# Patient Record
Sex: Female | Born: 1960 | Race: White | Hispanic: Yes | Smoking: Never smoker
Health system: Southern US, Community
[De-identification: ages and names within clinical notes are randomized; demographics above are authoritative.]

## PROBLEM LIST (undated history)

## (undated) DIAGNOSIS — Z923 Personal history of irradiation: Secondary | ICD-10-CM

## (undated) DIAGNOSIS — I10 Essential (primary) hypertension: Secondary | ICD-10-CM

## (undated) DIAGNOSIS — K219 Gastro-esophageal reflux disease without esophagitis: Secondary | ICD-10-CM

## (undated) DIAGNOSIS — F419 Anxiety disorder, unspecified: Secondary | ICD-10-CM

## (undated) DIAGNOSIS — T7840XA Allergy, unspecified, initial encounter: Secondary | ICD-10-CM

## (undated) DIAGNOSIS — F32A Depression, unspecified: Secondary | ICD-10-CM

## (undated) DIAGNOSIS — M797 Fibromyalgia: Secondary | ICD-10-CM

## (undated) DIAGNOSIS — F329 Major depressive disorder, single episode, unspecified: Secondary | ICD-10-CM

## (undated) DIAGNOSIS — N3281 Overactive bladder: Secondary | ICD-10-CM

## (undated) DIAGNOSIS — C50919 Malignant neoplasm of unspecified site of unspecified female breast: Secondary | ICD-10-CM

## (undated) DIAGNOSIS — G43909 Migraine, unspecified, not intractable, without status migrainosus: Secondary | ICD-10-CM

## (undated) DIAGNOSIS — M1990 Unspecified osteoarthritis, unspecified site: Secondary | ICD-10-CM

## (undated) HISTORY — DX: Anxiety disorder, unspecified: F41.9

## (undated) HISTORY — PX: BREAST LUMPECTOMY: SHX2

## (undated) HISTORY — DX: Overactive bladder: N32.81

## (undated) HISTORY — DX: Allergy, unspecified, initial encounter: T78.40XA

## (undated) HISTORY — DX: Unspecified osteoarthritis, unspecified site: M19.90

## (undated) HISTORY — DX: Malignant neoplasm of unspecified site of unspecified female breast: C50.919

## (undated) HISTORY — DX: Gastro-esophageal reflux disease without esophagitis: K21.9

## (undated) HISTORY — DX: Fibromyalgia: M79.7

## (undated) HISTORY — DX: Essential (primary) hypertension: I10

## (undated) HISTORY — DX: Depression, unspecified: F32.A

## (undated) HISTORY — PX: EYE SURGERY: SHX253

## (undated) HISTORY — DX: Major depressive disorder, single episode, unspecified: F32.9

## (undated) HISTORY — PX: BREAST BIOPSY: SHX20

## (undated) HISTORY — DX: Migraine, unspecified, not intractable, without status migrainosus: G43.909

---

## 1990-12-22 HISTORY — PX: PELVIC LAPAROSCOPY: SHX162

## 1998-03-20 ENCOUNTER — Ambulatory Visit (HOSPITAL_COMMUNITY): Admission: RE | Admit: 1998-03-20 | Discharge: 1998-03-20 | Payer: Self-pay | Admitting: Gynecology

## 1999-07-29 ENCOUNTER — Other Ambulatory Visit: Admission: RE | Admit: 1999-07-29 | Discharge: 1999-07-29 | Payer: Self-pay | Admitting: Gynecology

## 1999-10-12 ENCOUNTER — Encounter: Payer: Self-pay | Admitting: Family Medicine

## 1999-10-12 ENCOUNTER — Ambulatory Visit (HOSPITAL_COMMUNITY): Admission: RE | Admit: 1999-10-12 | Discharge: 1999-10-12 | Payer: Self-pay | Admitting: Family Medicine

## 2000-09-08 ENCOUNTER — Other Ambulatory Visit: Admission: RE | Admit: 2000-09-08 | Discharge: 2000-09-08 | Payer: Self-pay | Admitting: Gynecology

## 2001-10-13 ENCOUNTER — Other Ambulatory Visit: Admission: RE | Admit: 2001-10-13 | Discharge: 2001-10-13 | Payer: Self-pay | Admitting: Gynecology

## 2002-10-14 ENCOUNTER — Other Ambulatory Visit: Admission: RE | Admit: 2002-10-14 | Discharge: 2002-10-14 | Payer: Self-pay | Admitting: Gynecology

## 2003-10-23 ENCOUNTER — Other Ambulatory Visit: Admission: RE | Admit: 2003-10-23 | Discharge: 2003-10-23 | Payer: Self-pay | Admitting: Gynecology

## 2004-12-17 ENCOUNTER — Other Ambulatory Visit: Admission: RE | Admit: 2004-12-17 | Discharge: 2004-12-17 | Payer: Self-pay | Admitting: Gynecology

## 2005-02-06 ENCOUNTER — Ambulatory Visit (HOSPITAL_COMMUNITY): Admission: RE | Admit: 2005-02-06 | Discharge: 2005-02-06 | Payer: Self-pay | Admitting: Gynecology

## 2005-03-03 ENCOUNTER — Encounter: Admission: RE | Admit: 2005-03-03 | Discharge: 2005-03-03 | Payer: Self-pay | Admitting: Gynecology

## 2005-04-21 ENCOUNTER — Encounter: Admission: RE | Admit: 2005-04-21 | Discharge: 2005-04-21 | Payer: Self-pay | Admitting: Gynecology

## 2005-10-22 ENCOUNTER — Encounter: Admission: RE | Admit: 2005-10-22 | Discharge: 2005-10-22 | Payer: Self-pay | Admitting: Gynecology

## 2006-01-20 ENCOUNTER — Other Ambulatory Visit: Admission: RE | Admit: 2006-01-20 | Discharge: 2006-01-20 | Payer: Self-pay | Admitting: Gynecology

## 2006-04-13 ENCOUNTER — Encounter: Admission: RE | Admit: 2006-04-13 | Discharge: 2006-04-13 | Payer: Self-pay | Admitting: Gynecology

## 2006-05-08 ENCOUNTER — Encounter: Admission: RE | Admit: 2006-05-08 | Discharge: 2006-05-08 | Payer: Self-pay | Admitting: Gynecology

## 2006-12-09 ENCOUNTER — Encounter: Admission: RE | Admit: 2006-12-09 | Discharge: 2006-12-09 | Payer: Self-pay | Admitting: Gynecology

## 2007-05-11 ENCOUNTER — Encounter: Admission: RE | Admit: 2007-05-11 | Discharge: 2007-05-11 | Payer: Self-pay | Admitting: Gynecology

## 2007-05-13 ENCOUNTER — Encounter: Admission: RE | Admit: 2007-05-13 | Discharge: 2007-05-13 | Payer: Self-pay | Admitting: Gynecology

## 2007-05-13 ENCOUNTER — Encounter (INDEPENDENT_AMBULATORY_CARE_PROVIDER_SITE_OTHER): Payer: Self-pay | Admitting: Diagnostic Radiology

## 2007-05-20 ENCOUNTER — Encounter: Admission: RE | Admit: 2007-05-20 | Discharge: 2007-05-20 | Payer: Self-pay | Admitting: Gynecology

## 2007-05-21 ENCOUNTER — Other Ambulatory Visit: Admission: RE | Admit: 2007-05-21 | Discharge: 2007-05-21 | Payer: Self-pay | Admitting: Gynecology

## 2007-05-24 ENCOUNTER — Encounter: Admission: RE | Admit: 2007-05-24 | Discharge: 2007-05-24 | Payer: Self-pay | Admitting: Gynecology

## 2007-06-03 ENCOUNTER — Encounter (INDEPENDENT_AMBULATORY_CARE_PROVIDER_SITE_OTHER): Payer: Self-pay | Admitting: Radiology

## 2007-06-03 ENCOUNTER — Encounter: Admission: RE | Admit: 2007-06-03 | Discharge: 2007-06-03 | Payer: Self-pay | Admitting: Gynecology

## 2007-07-05 ENCOUNTER — Encounter: Admission: RE | Admit: 2007-07-05 | Discharge: 2007-07-05 | Payer: Self-pay | Admitting: General Surgery

## 2007-07-09 ENCOUNTER — Encounter: Admission: RE | Admit: 2007-07-09 | Discharge: 2007-07-09 | Payer: Self-pay | Admitting: General Surgery

## 2007-07-09 ENCOUNTER — Ambulatory Visit (HOSPITAL_BASED_OUTPATIENT_CLINIC_OR_DEPARTMENT_OTHER): Admission: RE | Admit: 2007-07-09 | Discharge: 2007-07-09 | Payer: Self-pay | Admitting: General Surgery

## 2007-07-09 ENCOUNTER — Encounter (INDEPENDENT_AMBULATORY_CARE_PROVIDER_SITE_OTHER): Payer: Self-pay | Admitting: General Surgery

## 2007-07-19 ENCOUNTER — Ambulatory Visit: Payer: Self-pay | Admitting: Oncology

## 2007-08-04 LAB — CBC WITH DIFFERENTIAL/PLATELET
BASO%: 1 % (ref 0.0–2.0)
Basophils Absolute: 0.1 10*3/uL (ref 0.0–0.1)
EOS%: 1 % (ref 0.0–7.0)
HGB: 13.5 g/dL (ref 11.6–15.9)
MCH: 28.9 pg (ref 26.0–34.0)
MCHC: 33.9 g/dL (ref 32.0–36.0)
MONO%: 6.4 % (ref 0.0–13.0)
RBC: 4.67 10*6/uL (ref 3.70–5.32)
RDW: 11.1 % — ABNORMAL LOW (ref 11.3–14.5)
lymph#: 1.4 10*3/uL (ref 0.9–3.3)

## 2007-08-09 ENCOUNTER — Ambulatory Visit (HOSPITAL_COMMUNITY): Admission: RE | Admit: 2007-08-09 | Discharge: 2007-08-09 | Payer: Self-pay | Admitting: Oncology

## 2007-08-09 LAB — COMPREHENSIVE METABOLIC PANEL
ALT: 21 U/L (ref 0–35)
AST: 14 U/L (ref 0–37)
Albumin: 4.8 g/dL (ref 3.5–5.2)
Alkaline Phosphatase: 55 U/L (ref 39–117)
Calcium: 10 mg/dL (ref 8.4–10.5)
Chloride: 102 mEq/L (ref 96–112)
Potassium: 4.2 mEq/L (ref 3.5–5.3)
Sodium: 139 mEq/L (ref 135–145)
Total Protein: 7.8 g/dL (ref 6.0–8.3)

## 2007-08-10 ENCOUNTER — Ambulatory Visit: Admission: RE | Admit: 2007-08-10 | Discharge: 2007-10-25 | Payer: Self-pay | Admitting: Radiation Oncology

## 2007-09-22 DIAGNOSIS — C50919 Malignant neoplasm of unspecified site of unspecified female breast: Secondary | ICD-10-CM

## 2007-09-22 HISTORY — DX: Malignant neoplasm of unspecified site of unspecified female breast: C50.919

## 2007-09-30 ENCOUNTER — Ambulatory Visit: Payer: Self-pay | Admitting: Oncology

## 2007-11-23 ENCOUNTER — Ambulatory Visit: Payer: Self-pay | Admitting: *Deleted

## 2007-11-23 ENCOUNTER — Emergency Department (HOSPITAL_COMMUNITY): Admission: EM | Admit: 2007-11-23 | Discharge: 2007-11-23 | Payer: Self-pay | Admitting: Emergency Medicine

## 2007-11-23 ENCOUNTER — Inpatient Hospital Stay (HOSPITAL_COMMUNITY): Admission: RE | Admit: 2007-11-23 | Discharge: 2007-11-26 | Payer: Self-pay | Admitting: *Deleted

## 2007-12-23 ENCOUNTER — Ambulatory Visit: Payer: Self-pay | Admitting: Oncology

## 2007-12-28 LAB — CBC WITH DIFFERENTIAL/PLATELET
BASO%: 1 % (ref 0.0–2.0)
Basophils Absolute: 0 10*3/uL (ref 0.0–0.1)
EOS%: 3.3 % (ref 0.0–7.0)
MCH: 30.2 pg (ref 26.0–34.0)
MCHC: 35.1 g/dL (ref 32.0–36.0)
MCV: 86 fL (ref 81.0–101.0)
MONO%: 7 % (ref 0.0–13.0)
RBC: 4.17 10*6/uL (ref 3.70–5.32)
RDW: 13.4 % (ref 11.3–14.5)

## 2007-12-28 LAB — COMPREHENSIVE METABOLIC PANEL
AST: 16 U/L (ref 0–37)
Albumin: 4.1 g/dL (ref 3.5–5.2)
Alkaline Phosphatase: 37 U/L — ABNORMAL LOW (ref 39–117)
BUN: 14 mg/dL (ref 6–23)
Potassium: 4.5 mEq/L (ref 3.5–5.3)

## 2008-02-09 ENCOUNTER — Ambulatory Visit: Payer: Self-pay | Admitting: Oncology

## 2008-02-18 ENCOUNTER — Encounter: Admission: RE | Admit: 2008-02-18 | Discharge: 2008-02-18 | Payer: Self-pay | Admitting: General Surgery

## 2008-05-11 ENCOUNTER — Encounter: Admission: RE | Admit: 2008-05-11 | Discharge: 2008-05-11 | Payer: Self-pay | Admitting: Gynecology

## 2008-07-22 ENCOUNTER — Ambulatory Visit: Payer: Self-pay | Admitting: Oncology

## 2008-09-12 ENCOUNTER — Ambulatory Visit: Payer: Self-pay | Admitting: Oncology

## 2008-09-12 LAB — CBC WITH DIFFERENTIAL/PLATELET
BASO%: 0.4 % (ref 0.0–2.0)
LYMPH%: 19.5 % (ref 14.0–48.0)
MCHC: 34.6 g/dL (ref 32.0–36.0)
MONO#: 0.3 10*3/uL (ref 0.1–0.9)
Platelets: 318 10*3/uL (ref 145–400)
RBC: 4.09 10*6/uL (ref 3.70–5.32)
RDW: 13 % (ref 11.3–14.5)
WBC: 5.9 10*3/uL (ref 3.9–10.0)

## 2008-09-12 LAB — COMPREHENSIVE METABOLIC PANEL
AST: 38 U/L — ABNORMAL HIGH (ref 0–37)
Albumin: 3.8 g/dL (ref 3.5–5.2)
Alkaline Phosphatase: 41 U/L (ref 39–117)
BUN: 12 mg/dL (ref 6–23)
Creatinine, Ser: 0.81 mg/dL (ref 0.40–1.20)
Potassium: 4.3 mEq/L (ref 3.5–5.3)

## 2008-09-12 LAB — HCG, SERUM, QUALITATIVE: Preg, Serum: NEGATIVE

## 2008-09-12 LAB — RESEARCH LABS

## 2009-01-30 ENCOUNTER — Ambulatory Visit: Payer: Self-pay | Admitting: Oncology

## 2009-03-21 ENCOUNTER — Ambulatory Visit: Payer: Self-pay | Admitting: Oncology

## 2009-03-26 LAB — CBC WITH DIFFERENTIAL/PLATELET
BASO%: 0.3 % (ref 0.0–2.0)
EOS%: 2.8 % (ref 0.0–7.0)
MCH: 30.3 pg (ref 25.1–34.0)
MCHC: 34.4 g/dL (ref 31.5–36.0)
RBC: 4.32 10*6/uL (ref 3.70–5.45)
RDW: 13.3 % (ref 11.2–14.5)
WBC: 6.1 10*3/uL (ref 3.9–10.3)
lymph#: 1.1 10*3/uL (ref 0.9–3.3)

## 2009-03-26 LAB — COMPREHENSIVE METABOLIC PANEL
ALT: 59 U/L — ABNORMAL HIGH (ref 0–35)
AST: 40 U/L — ABNORMAL HIGH (ref 0–37)
Calcium: 9.2 mg/dL (ref 8.4–10.5)
Chloride: 104 mEq/L (ref 96–112)
Creatinine, Ser: 0.76 mg/dL (ref 0.40–1.20)
Potassium: 4.2 mEq/L (ref 3.5–5.3)
Sodium: 136 mEq/L (ref 135–145)
Total Protein: 7.1 g/dL (ref 6.0–8.3)

## 2009-03-26 LAB — CANCER ANTIGEN 27.29: CA 27.29: 17 U/mL (ref 0–39)

## 2009-04-17 LAB — COMPREHENSIVE METABOLIC PANEL
ALT: 39 U/L — ABNORMAL HIGH (ref 0–35)
Albumin: 4 g/dL (ref 3.5–5.2)
CO2: 29 mEq/L (ref 19–32)
Calcium: 9.4 mg/dL (ref 8.4–10.5)
Chloride: 102 mEq/L (ref 96–112)
Potassium: 4.7 mEq/L (ref 3.5–5.3)
Sodium: 138 mEq/L (ref 135–145)
Total Bilirubin: 0.9 mg/dL (ref 0.3–1.2)
Total Protein: 7.5 g/dL (ref 6.0–8.3)

## 2009-05-14 ENCOUNTER — Encounter: Admission: RE | Admit: 2009-05-14 | Discharge: 2009-05-14 | Payer: Self-pay | Admitting: Oncology

## 2009-09-13 ENCOUNTER — Ambulatory Visit: Payer: Self-pay | Admitting: Oncology

## 2009-09-18 LAB — CBC WITH DIFFERENTIAL/PLATELET
Basophils Absolute: 0 10*3/uL (ref 0.0–0.1)
Eosinophils Absolute: 0 10*3/uL (ref 0.0–0.5)
HGB: 12.8 g/dL (ref 11.6–15.9)
LYMPH%: 20 % (ref 14.0–49.7)
MCV: 86.8 fL (ref 79.5–101.0)
MONO#: 0.3 10*3/uL (ref 0.1–0.9)
NEUT#: 4.7 10*3/uL (ref 1.5–6.5)
Platelets: 299 10*3/uL (ref 145–400)
RBC: 4.22 10*6/uL (ref 3.70–5.45)
RDW: 12.6 % (ref 11.2–14.5)
WBC: 6.4 10*3/uL (ref 3.9–10.3)

## 2009-09-19 LAB — COMPREHENSIVE METABOLIC PANEL
Albumin: 4.1 g/dL (ref 3.5–5.2)
BUN: 12 mg/dL (ref 6–23)
CO2: 23 mEq/L (ref 19–32)
Glucose, Bld: 117 mg/dL — ABNORMAL HIGH (ref 70–99)
Potassium: 4 mEq/L (ref 3.5–5.3)
Sodium: 140 mEq/L (ref 135–145)
Total Protein: 6.9 g/dL (ref 6.0–8.3)

## 2009-09-19 LAB — CANCER ANTIGEN 27.29: CA 27.29: 15 U/mL (ref 0–39)

## 2009-09-19 LAB — VITAMIN D 25 HYDROXY (VIT D DEFICIENCY, FRACTURES): Vit D, 25-Hydroxy: 35 ng/mL (ref 30–89)

## 2009-10-17 ENCOUNTER — Encounter: Payer: Self-pay | Admitting: Gynecology

## 2009-10-17 ENCOUNTER — Ambulatory Visit: Payer: Self-pay | Admitting: Oncology

## 2009-10-17 ENCOUNTER — Ambulatory Visit: Payer: Self-pay | Admitting: Gynecology

## 2009-10-17 ENCOUNTER — Other Ambulatory Visit: Admission: RE | Admit: 2009-10-17 | Discharge: 2009-10-17 | Payer: Self-pay | Admitting: Gynecology

## 2010-05-06 ENCOUNTER — Ambulatory Visit: Payer: Self-pay | Admitting: Gynecology

## 2010-05-08 ENCOUNTER — Encounter: Admission: RE | Admit: 2010-05-08 | Discharge: 2010-05-08 | Payer: Self-pay | Admitting: Gynecology

## 2010-06-18 ENCOUNTER — Ambulatory Visit: Payer: Self-pay | Admitting: Gynecology

## 2010-07-04 ENCOUNTER — Ambulatory Visit (HOSPITAL_COMMUNITY): Admission: RE | Admit: 2010-07-04 | Discharge: 2010-07-04 | Payer: Self-pay | Admitting: Oncology

## 2010-10-18 ENCOUNTER — Ambulatory Visit: Payer: Self-pay | Admitting: Oncology

## 2010-10-23 LAB — COMPREHENSIVE METABOLIC PANEL
ALT: 15 U/L (ref 0–35)
Alkaline Phosphatase: 42 U/L (ref 39–117)
CO2: 28 mEq/L (ref 19–32)
Creatinine, Ser: 0.83 mg/dL (ref 0.40–1.20)
Sodium: 142 mEq/L (ref 135–145)
Total Bilirubin: 0.7 mg/dL (ref 0.3–1.2)
Total Protein: 6.9 g/dL (ref 6.0–8.3)

## 2010-10-23 LAB — CBC WITH DIFFERENTIAL/PLATELET
BASO%: 0.8 % (ref 0.0–2.0)
EOS%: 2.2 % (ref 0.0–7.0)
HCT: 34.8 % (ref 34.8–46.6)
LYMPH%: 40.3 % (ref 14.0–49.7)
MCH: 30.8 pg (ref 25.1–34.0)
MCHC: 34.9 g/dL (ref 31.5–36.0)
MCV: 88.3 fL (ref 79.5–101.0)
MONO#: 0.3 10*3/uL (ref 0.1–0.9)
MONO%: 6.6 % (ref 0.0–14.0)
NEUT%: 50.1 % (ref 38.4–76.8)
Platelets: 310 10*3/uL (ref 145–400)
RBC: 3.95 10*6/uL (ref 3.70–5.45)
WBC: 5.2 10*3/uL (ref 3.9–10.3)

## 2010-10-29 ENCOUNTER — Ambulatory Visit: Payer: Self-pay | Admitting: Gynecology

## 2010-10-29 ENCOUNTER — Other Ambulatory Visit: Admission: RE | Admit: 2010-10-29 | Discharge: 2010-10-29 | Payer: Self-pay | Admitting: Gynecology

## 2010-10-30 ENCOUNTER — Ambulatory Visit: Payer: Self-pay | Admitting: Gynecology

## 2011-01-11 ENCOUNTER — Other Ambulatory Visit: Payer: Self-pay | Admitting: Oncology

## 2011-01-11 DIAGNOSIS — Z0000 Encounter for general adult medical examination without abnormal findings: Secondary | ICD-10-CM

## 2011-03-09 LAB — CREATININE, SERUM: GFR calc Af Amer: >60 mL/min (ref >60)

## 2011-04-14 ENCOUNTER — Other Ambulatory Visit: Payer: Self-pay | Admitting: Oncology

## 2011-04-14 DIAGNOSIS — Z0000 Encounter for general adult medical examination without abnormal findings: Secondary | ICD-10-CM

## 2011-04-15 ENCOUNTER — Ambulatory Visit
Admission: RE | Admit: 2011-04-15 | Discharge: 2011-04-15 | Disposition: A | Discharge status: Home / Self Care | Payer: BC Managed Care – PPO | Source: Ambulatory Visit | Attending: Oncology | Admitting: Oncology

## 2011-04-15 ENCOUNTER — Other Ambulatory Visit: Payer: Self-pay | Admitting: Oncology

## 2011-04-15 DIAGNOSIS — Z0000 Encounter for general adult medical examination without abnormal findings: Secondary | ICD-10-CM

## 2011-04-21 ENCOUNTER — Ambulatory Visit
Admission: RE | Admit: 2011-04-21 | Discharge: 2011-04-21 | Disposition: A | Discharge status: Home / Self Care | Payer: BC Managed Care – PPO | Source: Ambulatory Visit | Attending: Anesthesiology | Admitting: Anesthesiology

## 2011-04-21 ENCOUNTER — Other Ambulatory Visit: Payer: Self-pay | Admitting: Anesthesiology

## 2011-04-21 DIAGNOSIS — M549 Dorsalgia, unspecified: Secondary | ICD-10-CM

## 2011-05-06 NOTE — Discharge Summary (Signed)
Kiara Wilson, Kiara Wilson                ACCOUNT NO.:  [id number]   MEDICAL RECORD NO.:  [id number]          PATIENT TYPE:  IPS   LOCATION:  0603                          FACILITY[facility]  [facility]   PHYSICIAN:  Jasmine Pang, M.D. DATE OF BIRTH:  16-Mar-1961   DATE OF ADMISSION:  11/23/2007  DATE OF DISCHARGE:  11/26/2007                               DISCHARGE SUMMARY   IDENTIFICATION:  A 50 year old Latino female from [place], [place], who was admitted on a voluntary basis on November 23, 2007.   HISTORY OF PRESENT ILLNESS:  The patient admits to using alcohol and  narcotics abuse coupled with increased depression.  She denies active  suicidal ideation, but admits I would like to go to sleep and not wake  up.  She was taken off Wellbutrin 4 weeks ago, because of the  interaction with her tamoxifen.  Her depression has worsened since then.  The patient is voicing leaving her husband to give him his freedom  because of the burden.  She did report active suicidal ideation this  a.m. with a plan to overdose.  The patient is married.  She has no  children.  She is a Biochemist, clinical.  She has had a  history of depression by her statements.  Her mother and sister had a  history of depression as well.  The patient denies use of street drugs  or illegal prescription use.  She is suffering from fibromyalgia.  She  also had recent breast cancer and has migraine headaches.   CURRENT MEDICATIONS:  She is currently on tamoxifen, Lexapro, and  Robaxin.   ALLERGIES:  She is allergic to AMOXICILLIN and ULTRAM.   PHYSICAL FINDINGS:  There were no acute physical or medical problems  noted on exam.   HOSPITAL COURSE:  Upon admission, the patient was placed on Librium  detox protocol.  She was also placed on Ambien 10 mg p.o. q.h.s. p.r.n.  On November 23, 2007, her Lexapro was increased to 30 mg p.o. daily.  She  was placed on tamoxifen 20 mg at bedtime and Xanax 0.5 mg q.6 h.  p.r.n.  anxiety.  The Librium detox protocol was discontinued.  The patient  tolerated these medications well with no significant side effects.  She  was friendly and cooperative with me in individual sessions.  She also  participated appropriately in unit therapeutic groups and activities.  She discussed her increased drinking because she has begun to feel so  hopeless and has a depressed mood.  She sees Valinda Hoar, [place] for  medication management.  She states she does worse in the winter and  feels her fibromyalgia also has a role in her depression.  The patient's  mental status improved as hospitalization progressed.  There was no  suicidal or homicidal ideation.  Her husband was coming in for a family  session.  He was worried about her coming home too soon, but was very  supportive.  She was able to talk about some unresolved grief.  She has  concerning decision she has made in things that have  happened to her.  She is looking forward to exploring these options more in outpatient  therapy.  At the end of therapy session, her husband felt comfortable  with her leaving the hospital.  Her mood had improved from admission  status.  Mood was less depressed and anxious.  Affect wide range.  There  was no suicidal or homicidal ideation.  No thoughts of self-injurious  behavior.  No auditory or visual hallucinations.  No paranoia or  delusions.  Thoughts were logical and goal-directed.  Thought content,  no predominant theme.  Cognitive was grossly back to baseline.  It was  felt that the patient was safe to be discharged today.   DISCHARGE DIAGNOSES:  AXIS I:  Major depression, recurrent, severe  without psychosis.  Alcohol abuse and opiate abuse.  AXIS II:  None.  AXIS III:  Fibromyalgia, breast cancer, and migraine headaches.  AXIS IV:  Severe (problems with primary support group, occupational  problem, burden of psychiatric illness, burden of chemical abuse and  illness, burden of  medical problems).  AXIS V:  Global assessment of functioning was 50 upon discharge.  Global  assessment of functioning was 40 upon admission.  Global assessment of  functioning highest past year was 75.   DISCHARGE PLAN:  There were no specific activity level or dietary  restrictions.   POSTHOSPITAL CARE PLAN:  The patient will see Valinda Hoar, [place] on  December 01, 2007 at 11 o'clock a.m.  She will see Mertha Baars therapist  on December 01, 2007 at 5:30 p.m.   DISCHARGE MEDICATIONS:  Tamoxifen 20 mg p.o. q.h.s., Robaxin 500 mg p.o.  daily as directed by her family physician, Lexapro 30 mg daily, Xanax  0.25 mg 1 tablet up to twice a day if needed for anxiety, Lunesta to  resume as directed by her primary care physician for insomnia.      Jasmine Pang, M.D.  Electronically Signed     BHS/MEDQ  D:  12/17/2007  T:  12/17/2007  Job:  [id number]

## 2011-05-06 NOTE — H&P (Signed)
Kiara Wilson, Kiara Wilson                ACCOUNT NO.:  [id number]   MEDICAL RECORD NO.:  [id number]          PATIENT TYPE:  IPS   LOCATION:  0603                          [facility]ILITY[facility]  [facility]   PHYSICIAN:  Jasmine Pang, M.D. DATE OF BIRTH:  [birth year]   DATE OF ADMISSION:  11/23/2007  DATE OF DISCHARGE:                       PSYCHIATRIC ADMISSION ASSESSMENT   IDENTIFYING INFORMATION:  A 50 year old Latino female from [place]  who was admitted on a voluntary basis on November 23, 2007.   HISTORY OF PRESENT ILLNESS:  The patient admits to alcohol and narcotic  abuse accompanied with increasing depression.  She denies active  suicidal ideation, but admits I would like to go to sleep and not wake  up.  She was taken off of Wellbutrin four weeks ago, because of the  interaction with tamoxifen.  She is on tamoxifen for breast cancer,  which recently has metastasized.  She has had recent radiation therapy  as well.  Her depression is worsened since stopping the Wellbutrin.  She  is voicing leaving her husband to give him his freedom because of the  burden.  She did report active suicidal ideation on the morning of  admission with a plan to overdose.  The patient has been seen by  Valinda Hoar for medication management.  She was on Wellbutrin 150 mg  p.o. daily and Lexapro 20 mg p.o. daily.  She had to stop the Wellbutrin  recently due to the negative interaction with tamoxifen.  She reports a  worsening of her depressive symptoms during the past several weeks after  discontinuing it.   SOCIAL HISTORY:  The patient is married; without children.  She is a  Biochemist, clinical.  She states her husband is very  supportive, but she feels that she has been a burden to him.  She is  unhappy in her job and had been in a previous job that she loved quite a  bit.  She feels bad for having left that job.   FAMILY HISTORY:  The patient's mother and sister have depression.   SUBSTANCE  ABUSE HISTORY:  The patient is abusing alcohol, as indicated  above.  She admits to drinking too much for the past week to the point  of getting drunk.  She also has been increasing using her pain  medications because she has felt so much more pain recently.  She has  fibromyalgia.  She denies street drugs or illegal drug use.   PAST MEDICAL HISTORY:  The patient has fibromyalgia.  She has breast  cancer and migraine headaches.   CURRENT MEDICATIONS:  She is currently on tamoxifen, Lexapro, and  Robaxin.   DRUG ALLERGIES:  AMOXICILLIN AND ULTRAM.   PHYSICAL FINDINGS:  See the physical exam.  No acute abnormal physical  or medical problems noted.   MENTAL STATUS EXAM:  The patient was friendly and cooperative and  appropriate.  She was crying.  She was well dressed with good hygiene.  Speech was articulate, soft, and slow.  Mood was depressed.  Affect was  consistent with mood.  Her eye contact was good.  Affect was also  tearful.  There was currently no suicidal or homicidal ideation.  No  thoughts of self-injurious behavior.  She states the suicidal ideation  has resolved.  The thought processes were logical and goal-directed.  Thought content, no predominant theme.  Cognitive was grossly intact.   DISCHARGE DIAGNOSES:  AXIS I:  Major depression, recurrent, severe  without psychosis.  Alcohol abuse and opiate abuse.  AXIS II:  Deferred.  AXIS III:  Fibromyalgia, migraine headaches, and breast cancer.  AXIS IV:  Severe (problems with primary support group, occupational  problem, medical issues, and burden of psychiatric illness).  AXIS V:  Global assessment of functioning was 40 upon admission.  Global  assessment of functioning highest past year was 75.   PLAN:  The patient will be involved in unit therapeutic groups and  activities.  Her Lexapro will be increased from 20 mg to 30 mg daily.  We discussed the possibility of Cymbalta at some point.  If the Lexapro  increase does  not work, we will assist the patient with short-term  disability paperwork sheet that she should not be going out to work at  this point.  Once this was decided, she felt much better.  We will also  do a complete physical exam.  Total length of stay will be 3-5 days,  possibly discharge on November 25, 2007, if she feels well enough.      Jasmine Pang, M.D.  Electronically Signed     BHS/MEDQ  D:  11/24/2007  T:  11/25/2007  Job:  [id number]

## 2011-05-06 NOTE — Op Note (Signed)
Kiara Wilson, Kiara Wilson                ACCOUNT NO.:  [id number]   MEDICAL RECORD NO.:  [id number]          PATIENT TYPE:  AMB   LOCATION:  DSC                          FACILITY:  [facility]   PHYSICIAN:  Ollen Gross. Vernell Morgans, M.D. DATE OF BIRTH:  [birth year]   DATE OF PROCEDURE:  07/09/2007  DATE OF DISCHARGE:  07/09/2007                               OPERATIVE REPORT   PREOPERATIVE DIAGNOSIS:  Right breast DCIS.   POSTOPERATIVE DIAGNOSIS:  Right breast DCIS.   PROCEDURE:  Right breast needle-localized lumpectomy and sentinel lymph  node biopsy x5 with injection of blue dye.   SURGEON:  Ollen Gross. Vernell Morgans, M.D.   ANESTHESIA:  General.   PROCEDURE:  After informed consent was obtained, the patient was brought  to the operating room, placed in supine position on the table.  After  induction of general anesthesia, the patient's right breast and axilla  were prepped with Betadine and draped in usual sterile manner.  Earlier  in the day the patient had undergone an injection of 1 mCi of technetium  sulfur colloid in the subareolar area.  She had also undergone a wire  localization procedure and the wire was entering the breast in the 12  o'clock position heading inferiorly. At this point 2 mL of methylene  blue and 3 mL of injectable saline were injected also in the subareolar  area and the breast was massaged for several minutes.  A NeoProbe with  the device was used to identify an area of the increased activity in the  right axilla.  A small transverse incision was made with a 15 blade  knife overlying this hot spot.  This incision was carried down through  the skin and subcutaneous tissue sharply with electrocautery until the  axilla was entered using the NeoProbe to guide direction of blunt  dissection was carried out in the axilla using a hemostat until a hot  lymph node was identified. This first lymph node was not blue.  It had  ex vivo counts around 1500, it was sent as sentinel node #1.  A  second  lymph node was also identified using the NeoProbe and blunt dissection.  This lymph node was hot and blue and had ex vivo counts around 4500 and  was sent as sentinel node #2.  Three other small sentinel nodes were  identified that were hot but not blue and these were all excised by  combination of sharp Bovie dissection and ligation of the lymphatics  with hemostats dividing them and ligating with 3-0 Vicryl ties.  Once  this was accomplished, there were five sentinel nodes sent. There was no  other palpable lymph nodes or significant activity in the axilla.  The  wound was examined, found be hemostatic.  Deep layer of the wound was  closed with interrupted 3-0 Vicryl stitches.  The skin was closed a  running 4-0 Monocryl subcuticular stitch.  Next attention was turned to  the right breast.  The mass in the right breast was in the 12 o'clock  position.  The curvilinear incision was made just above  the nipple-  areolar complex overlying the mass.  This was done with a 10 blade  knife.  This incision was carried down through the skin and subcutaneous  tissue sharply with electrocautery.  Once the breast tissue was entered  the path of the wire was able to be appreciated and there was some  palpable mass in this upper portion of the breast.  The mass that was  palpable in the path of the wire was widely excised sharply using  electrocautery.  This was done in a circumferential manner all way  around the path of the wire.  Once this area had been completely removed  and separated from the rest of the breast tissue, it was marked with a  short stitch being superior and long stitch being lateral, sent to  radiology for x-rays and pathology for touch preps. Touch preps of the  lymph nodes were negative.  Touch preps of the margins of the specimen  were negative.  Hemostasis was achieved using Bovie electrocautery.  The  wound was irrigated copious amounts of saline.  The deep layer of  the  wound was then closed with interrupted 3-0 Vicryl stitches and skin was  closed with running 4-0 Monocryl subcuticular stitch.  A Dermabond  dressing was then  applied along with some sterile dressings.  The patient tolerated the  procedure well.  At the end of the case all needle, sponge, and  instrument counts were correct.  The patient was then awakened and taken  recovery in stable condition.      Ollen Gross. Vernell Morgans, M.D.  Electronically Signed     PST/MEDQ  D:  07/11/2007  T:  07/12/2007  Job:  [id number]

## 2011-05-12 ENCOUNTER — Ambulatory Visit
Admission: RE | Admit: 2011-05-12 | Discharge: 2011-05-12 | Disposition: A | Discharge status: Home / Self Care | Payer: BC Managed Care – PPO | Source: Ambulatory Visit | Attending: Oncology | Admitting: Oncology

## 2011-05-12 DIAGNOSIS — Z0000 Encounter for general adult medical examination without abnormal findings: Secondary | ICD-10-CM

## 2011-09-29 LAB — RAPID URINE DRUG SCREEN, HOSP PERFORMED
Benzodiazepines: NOT DETECTED
Cocaine: NOT DETECTED

## 2011-09-29 LAB — CBC
HCT: 37.5
MCV: 85.6
Platelets: 294
RBC: 4.39
WBC: 6.4

## 2011-09-29 LAB — COMPREHENSIVE METABOLIC PANEL
BUN: 6
CO2: 21
Chloride: 110
Creatinine, Ser: 0.7
GFR calc non Af Amer: >60
Total Bilirubin: 1

## 2011-09-29 LAB — DIFFERENTIAL
Basophils Absolute: 0.1
Lymphocytes Relative: 16
Monocytes Absolute: 0.4
Monocytes Relative: 6
Neutro Abs: 4.9

## 2011-09-29 LAB — PREGNANCY, URINE: Preg Test, Ur: NEGATIVE

## 2011-10-07 LAB — DIFFERENTIAL
Basophils Absolute: 0
Lymphocytes Relative: 12
Lymphs Abs: 0.8
Neutro Abs: 5.4
Neutrophils Relative %: 80 — ABNORMAL HIGH

## 2011-10-07 LAB — COMPREHENSIVE METABOLIC PANEL
AST: 31
BUN: 7
CO2: 29
Calcium: 9.5
Creatinine, Ser: 0.73
GFR calc Af Amer: >60
GFR calc non Af Amer: >60
Glucose, Bld: 124 — ABNORMAL HIGH

## 2011-10-07 LAB — CBC
HCT: 38.5
MCHC: 34.4
MCV: 84.8
RBC: 4.54

## 2011-10-17 ENCOUNTER — Encounter: Payer: Self-pay | Admitting: *Deleted

## 2011-10-17 ENCOUNTER — Other Ambulatory Visit (HOSPITAL_COMMUNITY)
Admission: RE | Admit: 2011-10-17 | Discharge: 2011-10-17 | Disposition: A | Discharge status: Home / Self Care | Payer: BC Managed Care – PPO | Source: Ambulatory Visit | Attending: Family Medicine | Admitting: Family Medicine

## 2011-10-17 ENCOUNTER — Other Ambulatory Visit: Payer: Self-pay | Admitting: Physician Assistant

## 2011-10-17 DIAGNOSIS — Z0000 Encounter for general adult medical examination without abnormal findings: Secondary | ICD-10-CM | POA: Insufficient documentation

## 2011-11-20 ENCOUNTER — Other Ambulatory Visit: Payer: Self-pay | Admitting: *Deleted

## 2011-11-20 MED ORDER — TAMOXIFEN CITRATE 20 MG PO TABS: 20.0000 mg | ORAL_TABLET | Freq: Every day | ORAL | Status: DC

## 2011-12-15 ENCOUNTER — Other Ambulatory Visit: Payer: Self-pay | Admitting: Oncology

## 2011-12-15 DIAGNOSIS — C50219 Malignant neoplasm of upper-inner quadrant of unspecified female breast: Secondary | ICD-10-CM

## 2011-12-18 ENCOUNTER — Other Ambulatory Visit: Payer: Self-pay | Admitting: *Deleted

## 2011-12-18 ENCOUNTER — Other Ambulatory Visit: Payer: Self-pay | Admitting: Oncology

## 2011-12-18 ENCOUNTER — Other Ambulatory Visit (HOSPITAL_BASED_OUTPATIENT_CLINIC_OR_DEPARTMENT_OTHER): Payer: Medicare Other | Admitting: Lab

## 2011-12-18 DIAGNOSIS — C50219 Malignant neoplasm of upper-inner quadrant of unspecified female breast: Secondary | ICD-10-CM

## 2011-12-18 DIAGNOSIS — C50419 Malignant neoplasm of upper-outer quadrant of unspecified female breast: Secondary | ICD-10-CM

## 2011-12-18 DIAGNOSIS — Z170 Estrogen receptor positive status [ER+]: Secondary | ICD-10-CM

## 2011-12-18 LAB — CBC WITH DIFFERENTIAL/PLATELET
Eosinophils Absolute: 0.1 10*3/uL (ref 0.0–0.5)
HCT: 34 % — ABNORMAL LOW (ref 34.8–46.6)
LYMPH%: 24.8 % (ref 14.0–49.7)
MONO#: 0.4 10*3/uL (ref 0.1–0.9)
NEUT#: 3.7 10*3/uL (ref 1.5–6.5)
NEUT%: 65.8 % (ref 38.4–76.8)
Platelets: 304 10*3/uL (ref 145–400)
WBC: 5.7 10*3/uL (ref 3.9–10.3)
lymph#: 1.4 10*3/uL (ref 0.9–3.3)

## 2011-12-18 LAB — COMPREHENSIVE METABOLIC PANEL
ALT: 19 U/L (ref 0–35)
CO2: 27 mEq/L (ref 19–32)
Calcium: 9.4 mg/dL (ref 8.4–10.5)
Chloride: 106 mEq/L (ref 96–112)
Creatinine, Ser: 0.84 mg/dL (ref 0.50–1.10)
Glucose, Bld: 86 mg/dL (ref 70–99)
Sodium: 140 mEq/L (ref 135–145)
Total Bilirubin: 0.5 mg/dL (ref 0.3–1.2)
Total Protein: 7.1 g/dL (ref 6.0–8.3)

## 2011-12-18 LAB — CANCER ANTIGEN 27.29: CA 27.29: 14 U/mL (ref 0–39)

## 2011-12-18 MED ORDER — TAMOXIFEN CITRATE 20 MG PO TABS: 20.0000 mg | ORAL_TABLET | Freq: Every day | ORAL | Status: DC

## 2011-12-25 ENCOUNTER — Ambulatory Visit (HOSPITAL_BASED_OUTPATIENT_CLINIC_OR_DEPARTMENT_OTHER): Payer: Medicare Other | Admitting: Oncology

## 2011-12-25 VITALS — BP 131/85 | HR 96 | Temp 98.6°F | Ht 64.0 in | Wt 174.1 lb

## 2011-12-25 DIAGNOSIS — C50919 Malignant neoplasm of unspecified site of unspecified female breast: Secondary | ICD-10-CM

## 2011-12-25 DIAGNOSIS — Z923 Personal history of irradiation: Secondary | ICD-10-CM

## 2011-12-25 DIAGNOSIS — Z79810 Long term (current) use of selective estrogen receptor modulators (SERMs): Secondary | ICD-10-CM

## 2011-12-25 DIAGNOSIS — Z170 Estrogen receptor positive status [ER+]: Secondary | ICD-10-CM

## 2011-12-25 NOTE — Progress Notes (Signed)
ID: Kiara Wilson  DOB: [birth year]  MR#: [id number]  CSN#: [id number]   Interval History:   Is doing generally quite well. She is not employed at present. She is going to the gym 5 times a week. She is helping with her parents in [place] They are in their 22s and declining. She does feel a bit depressed partly because of the season and partly because she is on the lonely side. She is very active in her church and she says that also is helping.  ROS:  She tells me when she last visited her parents she had some palpitations she went to the emergency room and she had a stress test. That apparently was unremarkable. She continues to have fibromyalgia pain "all over her body". She continues to fight depressive symptoms. Sometimes she feels her heart is beating fast. She takes clonazepam for that. She sleeps poorly partly because of fibromyalgia partly because of hot flashes. She feels forgetful. A detailed review of systems was otherwise unremarkable. Allergies  Allergen Reactions  . Amoxicillin   . Tramadol Hcl Other (See Comments)    Current Outpatient Prescriptions  Medication Sig Dispense Refill  . carisoprodol (SOMA) 350 MG tablet Take 350 mg by mouth as needed.        . clonazePAM (KLONOPIN) 1 MG tablet Take 1 mg by mouth 2 (two) times daily as needed.        . diclofenac (VOLTAREN) 75 MG EC tablet Take 75 mg by mouth 1 day or 1 dose.        . docusate sodium (COLACE) 100 MG capsule Take 100 mg by mouth 2 (two) times daily as needed.        Marland Kitchen esomeprazole (NEXIUM) 40 MG capsule Take 40 mg by mouth daily before breakfast.        . losartan (COZAAR) 50 MG tablet Take 50 mg by mouth daily.        . sertraline (ZOLOFT) 50 MG tablet Take 50 mg by mouth daily.        . tamoxifen (NOLVADEX) 20 MG tablet Take 1 tablet (20 mg total) by mouth daily.  30 tablet  12  . traMADol (ULTRAM) 50 MG tablet Take 50 mg by mouth every 6 (six) hours as needed. Maximum dose= 8 tablets per day           Objective:  Filed Vitals:   12/25/11 0913  BP: 131/85  Pulse: 96  Temp: 98.6 F (37 C)    BMI: Body mass index is 29.88 kg/(m^2).   ECOG FS:  Physical Exam:   Sclerae unicteric  Oropharynx clear  No peripheral adenopathy  Lungs clear -- no rales or rhonchi  Heart regular rate and rhythm  Abdomen benign  MSK no focal spinal tenderness, no peripheral edema  Neuro nonfocal  Breast exam: Right breast status post lumpectomy and radiation no evidence of local recurrence left breast no suspicious findings  Lab Results:      Chemistry      Component Value Date/Time   NA 140 12/18/2011 1245   NA 140 12/18/2011 1245   K 4.3 12/18/2011 1245   K 4.3 12/18/2011 1245   CL 106 12/18/2011 1245   CL 106 12/18/2011 1245   CO2 27 12/18/2011 1245   CO2 27 12/18/2011 1245   BUN 13 12/18/2011 1245   BUN 13 12/18/2011 1245   CREATININE 0.84 12/18/2011 1245   CREATININE 0.84 12/18/2011 1245      Component Value  Date/Time   CALCIUM 9.4 12/18/2011 1245   CALCIUM 9.4 12/18/2011 1245   ALKPHOS 35* 12/18/2011 1245   ALKPHOS 35* 12/18/2011 1245   AST 20 12/18/2011 1245   AST 20 12/18/2011 1245   ALT 19 12/18/2011 1245   ALT 19 12/18/2011 1245   BILITOT 0.5 12/18/2011 1245   BILITOT 0.5 12/18/2011 1245       Lab Results  Component Value Date   WBC 5.7 12/18/2011   HGB 11.6 12/18/2011   HCT 34.0* 12/18/2011   MCV 89.3 12/18/2011   PLT 304 12/18/2011   NEUTROABS 3.7 12/18/2011    Studies/Results:  No results found.  Assessment: A 51 year old [place] woman status post right lumpectomy and sentinel lymph node biopsy in 06/2007 for a T1c, N0, grade 3 invasive ductal carcinoma, ER/PR positive, HER-2/neu negative with MIB-1 of 24%.  Status post radiation therapy.  On tamoxifen since 09/2007.  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  The plan is to continue on tamoxifen for a total of 5 years, until 09/2012.  Multiple  comorbidities including hypertension, GERD, and fibromyalgia.   Plan: We discussed gabapentin at bedtime for hot flashes and fibromyalgia pain and she will give this a try. We also discussed the new data continuing tamoxifen for 10 years this does reduce the risk of recurrence but only minimally so in a case like hers. She is really not interested and wants to stop tamoxifen after the next visit here which will be October of this year she is followed by Dr. Haynes Dage at [facility] I expect the patient will be released from followup here after the October visit.  MAGRINAT,GUSTAV C 12/25/2011

## 2011-12-29 ENCOUNTER — Telehealth: Payer: Self-pay | Admitting: Oncology

## 2011-12-29 ENCOUNTER — Other Ambulatory Visit: Payer: Self-pay | Admitting: *Deleted

## 2011-12-29 DIAGNOSIS — C50919 Malignant neoplasm of unspecified site of unspecified female breast: Secondary | ICD-10-CM

## 2011-12-29 DIAGNOSIS — C50219 Malignant neoplasm of upper-inner quadrant of unspecified female breast: Secondary | ICD-10-CM

## 2011-12-29 MED ORDER — GABAPENTIN 300 MG PO CAPS: 300.0000 mg | ORAL_CAPSULE | Freq: Every day | ORAL | Status: DC

## 2011-12-29 MED ORDER — TAMOXIFEN CITRATE 20 MG PO TABS: 20.0000 mg | ORAL_TABLET | Freq: Every day | ORAL | Status: DC

## 2011-12-29 NOTE — Telephone Encounter (Signed)
pt called and I provided her with appts for oct2013

## 2012-03-02 ENCOUNTER — Other Ambulatory Visit: Payer: Self-pay | Admitting: Oncology

## 2012-03-02 DIAGNOSIS — N6322 Unspecified lump in the left breast, upper inner quadrant: Secondary | ICD-10-CM

## 2012-03-02 DIAGNOSIS — Z9889 Other specified postprocedural states: Secondary | ICD-10-CM

## 2012-03-02 DIAGNOSIS — Z853 Personal history of malignant neoplasm of breast: Secondary | ICD-10-CM

## 2012-03-10 ENCOUNTER — Telehealth: Payer: Self-pay | Admitting: *Deleted

## 2012-03-10 ENCOUNTER — Other Ambulatory Visit: Payer: Self-pay | Admitting: Oncology

## 2012-03-10 ENCOUNTER — Other Ambulatory Visit: Payer: Self-pay | Admitting: *Deleted

## 2012-03-10 DIAGNOSIS — N6322 Unspecified lump in the left breast, upper inner quadrant: Secondary | ICD-10-CM

## 2012-03-10 DIAGNOSIS — Z853 Personal history of malignant neoplasm of breast: Secondary | ICD-10-CM

## 2012-03-10 NOTE — Telephone Encounter (Signed)
Made patient appointment for ultrasound and mammogram at the breast center

## 2012-03-10 NOTE — Progress Notes (Signed)
Pt called to this RN to state concern due to noted palpable masses in each breast.  Pt is scheduled for routine mammo in May- This RN requested diagnostic mammo with U/S ASAP due to noted concerns.  Electronic order and POF sent to scheduling.

## 2012-03-12 ENCOUNTER — Telehealth: Payer: Self-pay | Admitting: *Deleted

## 2012-03-12 NOTE — Telephone Encounter (Signed)
called patient and gave her information at the breast center for mammogram and ultrasound on 03-16-2012

## 2012-03-17 ENCOUNTER — Ambulatory Visit
Admission: RE | Admit: 2012-03-17 | Discharge: 2012-03-17 | Disposition: A | Discharge status: Home / Self Care | Payer: Medicare Other | Source: Ambulatory Visit | Attending: Oncology | Admitting: Oncology

## 2012-03-17 DIAGNOSIS — N6322 Unspecified lump in the left breast, upper inner quadrant: Secondary | ICD-10-CM

## 2012-03-17 DIAGNOSIS — Z853 Personal history of malignant neoplasm of breast: Secondary | ICD-10-CM

## 2012-09-02 ENCOUNTER — Telehealth: Payer: Self-pay | Admitting: Oncology

## 2012-09-02 NOTE — Telephone Encounter (Signed)
S/w the pt and she is r/s for her md appt on 10/18/2012@10 :00am from 1:30pm

## 2012-10-11 ENCOUNTER — Other Ambulatory Visit (HOSPITAL_BASED_OUTPATIENT_CLINIC_OR_DEPARTMENT_OTHER): Payer: Medicare Other | Admitting: Lab

## 2012-10-11 DIAGNOSIS — C50919 Malignant neoplasm of unspecified site of unspecified female breast: Secondary | ICD-10-CM

## 2012-10-11 LAB — CBC WITH DIFFERENTIAL/PLATELET
BASO%: 1.3 % (ref 0.0–2.0)
EOS%: 5.6 % (ref 0.0–7.0)
HCT: 36.2 % (ref 34.8–46.6)
MCH: 30.2 pg (ref 25.1–34.0)
MCHC: 33.6 g/dL (ref 31.5–36.0)
MONO#: 0.3 10*3/uL (ref 0.1–0.9)
NEUT%: 62 % (ref 38.4–76.8)
RDW: 12.8 % (ref 11.2–14.5)
WBC: 6 10*3/uL (ref 3.9–10.3)
lymph#: 1.5 10*3/uL (ref 0.9–3.3)

## 2012-10-18 ENCOUNTER — Ambulatory Visit (HOSPITAL_BASED_OUTPATIENT_CLINIC_OR_DEPARTMENT_OTHER): Payer: Medicare Other | Admitting: Oncology

## 2012-10-18 VITALS — BP 123/76 | HR 107 | Temp 99.1°F | Resp 20 | Ht 64.0 in | Wt 174.7 lb

## 2012-10-18 DIAGNOSIS — C50419 Malignant neoplasm of upper-outer quadrant of unspecified female breast: Secondary | ICD-10-CM

## 2012-10-18 DIAGNOSIS — C50919 Malignant neoplasm of unspecified site of unspecified female breast: Secondary | ICD-10-CM

## 2012-10-18 DIAGNOSIS — R52 Pain, unspecified: Secondary | ICD-10-CM

## 2012-10-18 DIAGNOSIS — F341 Dysthymic disorder: Secondary | ICD-10-CM

## 2012-10-18 DIAGNOSIS — Z170 Estrogen receptor positive status [ER+]: Secondary | ICD-10-CM

## 2012-10-18 NOTE — Progress Notes (Signed)
ID: Kiara Wilson   DOB: 17-Jun-1961  MR#: [id number]  CSN#:[id number]  PCP: Celso Amy PA. [facility] Sprang [facility] GYN: Chiquita Loth SU: P Janith Lima OTHER MD: Thyra Breed, Emerson Monte   HISTORY OF PRESENT ILLNESS: The patient palpated a change in her right breast late 2007.  She brought this to Dr. Reynold Bowen attention and he according to the patient attempted an aspiration, but "nothing came out," so she was sent for mammography, which was performed in December of 2007.  I do not have that particular report, but it was felt to be essentially unremarkable and the patient was scheduled for repeat screening mammography in May of 2008.  This was performed May 20 and showed, in addition to a very dense breast, a possible mass in the right breast.  The patient was called back for further studies, May 22, and this showed no palpable mass by exam.  Minimal distortion on the mammography; however, on the ultrasound there was an ill-defined hypoechoic area measuring up to 1.2 cm.  The previously aspirated cyst was seen only as a small remnant in that location.  It was not clear whether this mass represented fibrosis/scarring, or a new development.  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  With this information, the patient was referred to Dr. Carolynne Edouard and bilateral breast MRIs were obtained May 29.  This showed an area of clumped nodular enhancement in the right breast measuring up to 3.8 cm, but additionally in the left breast there was a 2.6 cm area, which appeared suspicious.  Accordingly, a left breast MRI-guided biopsy was performed on June 12.  This showed ([id number]) only fibrocystic changes.  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  The invasive tumor was grade 3.  There was no evidence of lymphovascular invasion, margins were negative and 0 of 5 sentinel lymph nodes were involved.  A prognostic panel on the invasive tumor showed it to be 57% ER positive, 53% PR positive, 24% by Ki-67 and negative on the HercepTest at 1+.  INTERVAL HISTORY: Kiara Wilson returns today for followup of her breast cancer. The interval history is stable. Unfortunately she continues to have severe pain from her fibromyalgia and musculoskeletal issues. She is followed by Dr. Vear Clock for this.  REVIEW OF SYSTEMS: As far as the tamoxifen is concerned mainly she has hot flashes and vaginal discharge. She did have a period about a month ago. She had not had any periods for the year prior. She describes her recent period as "normal". She sleeps poorly is significantly fatigued, and a stabbing throbbing and achy pains are constant and pretty much "from my feet to my head". She is anxious and depressed. Otherwise a detailed review of systems today was stable.  PAST MEDICAL HISTORY: Past Medical History  Diagnosis Date  . Breast cancer 09/2007  . Migraines   . Fibromyalgia    Significant for the patient being status post laparoscopy in the 1990s for what proved to be adhesions, status post radial keratotomy remotely with a history of fibromyalgia diagnosed in the 1990s and followed at one time by Dr. Jimmy Footman, but she no longer sees him.  She was followed briefly by Dr. Thyra Breed and she  is not followed there either at this point.  She has a history of migraines approximately two years ago, but they have not recurred.  She has a hiatal hernia with mild GERD symptoms  PAST SURGICAL HISTORY: No past surgical history on file.  FAMILY HISTORY No family history on file. The patient's parents are both still living, but in poor health.  Her mother has rheumatoid arthritis.  Her father has osteoarthritis.  She has three sisters and  two brothers.  There is no history of breast or ovarian cancer in the family.    GYNECOLOGIC HISTORY: GX P0; she appears to be. Menopausal, with a. September of 2013, but no periods for about 10 months before that.  SOCIAL HISTORY: The patient worked at [employer] as a Engineer, site, but is now disabled.  Her husband Kiara Wilson works [employer].  The patient is originally from [place]  Kiara Wilson is also of a Hispanic parentage, although he is from [place].  They are both Jehovah Witnesses.   ADVANCED DIRECTIVES: The patient tells me she would not want any red cell blood products under any circumstances including life saving circumstances, but she might be able to accept platelets of plasma or other components that do not involve red cells (and probably also not white cells), but issue would have to be discussed with her or her health care power of attorney, who is Kiara Wilson.  Those documents are being separately scanned.    HEALTH MAINTENANCE: History  Substance Use Topics  . Smoking status: Not on file  . Smokeless tobacco: Not on file  . Alcohol Use:      Colonoscopy:  PAP:  Bone density:  Lipid panel:  Allergies  Allergen Reactions  . Amoxicillin   . Tramadol Hcl Other (See Comments)    Current Outpatient Prescriptions  Medication Sig Dispense Refill  . carisoprodol (SOMA) 350 MG tablet Take 350 mg by mouth as needed.        . clonazePAM (KLONOPIN) 1 MG tablet Take 1 mg by mouth 2 (two) times daily as needed.        . diclofenac (VOLTAREN) 75 MG EC tablet Take 75 mg by mouth 1 day or 1 dose.        . docusate sodium (COLACE) 100 MG capsule Take 100 mg by mouth 2 (two) times daily as needed.        Marland Kitchen esomeprazole (NEXIUM) 40 MG capsule Take 40 mg by mouth daily before breakfast.        . gabapentin (NEURONTIN) 300 MG capsule Take 1 capsule (300 mg total) by mouth at bedtime.  300 capsule  4  . losartan (COZAAR) 50 MG tablet Take 50 mg by mouth daily.         . sertraline (ZOLOFT) 50 MG tablet Take 50 mg by mouth daily.        . tamoxifen (NOLVADEX) 20 MG tablet Take 1 tablet (20 mg total) by mouth daily.  90 tablet  4  . traMADol (ULTRAM) 50 MG tablet Take 50 mg by mouth every 6 (six) hours as needed. Maximum dose= 8 tablets per day         OBJECTIVE: Middle-aged [place] woman in no acute distress Filed Vitals:   10/18/12 1011  BP: 123/76  Pulse: 107  Temp: 99.1 F (37.3 C)  Resp: 20     Body mass index is 29.99 kg/(m^2).    ECOG FS: 2 is there is a  Sclerae unicteric Oropharynx  clear No cervical or supraclavicular adenopathy Lungs no rales or rhonchi Heart regular rate and rhythm Abd benign MSK no focal spinal tenderness, no peripheral edema Neuro: nonfocal Breasts: The right breast is status post lumpectomy and radiation. There is no evidence of local recurrence. The left breast is unremarkable.   LAB RESULTS: Lab Results  Component Value Date   WBC 6.0 10/11/2012   NEUTROABS 3.7 10/11/2012   HGB 12.2 10/11/2012   HCT 36.2 10/11/2012   MCV 90.0 10/11/2012   PLT 301 10/11/2012      Chemistry      Component Value Date/Time   NA 140 12/18/2011 1245   NA 140 12/18/2011 1245   K 4.3 12/18/2011 1245   K 4.3 12/18/2011 1245   CL 106 12/18/2011 1245   CL 106 12/18/2011 1245   CO2 27 12/18/2011 1245   CO2 27 12/18/2011 1245   BUN 13 12/18/2011 1245   BUN 13 12/18/2011 1245   CREATININE 0.84 12/18/2011 1245   CREATININE 0.84 12/18/2011 1245      Component Value Date/Time   CALCIUM 9.4 12/18/2011 1245   CALCIUM 9.4 12/18/2011 1245   ALKPHOS 35* 12/18/2011 1245   ALKPHOS 35* 12/18/2011 1245   AST 20 12/18/2011 1245   AST 20 12/18/2011 1245   ALT 19 12/18/2011 1245   ALT 19 12/18/2011 1245   BILITOT 0.5 12/18/2011 1245   BILITOT 0.5 12/18/2011 1245       Lab Results  Component Value Date   LABCA2 14 12/18/2011   LABCA2 14 12/18/2011    No components found with this basename: [id number]    No results found  for this basename: INR:1;PROTIME:1 in the last 168 hours  Urinalysis No results found for this basename: colorurine, appearanceur, labspec, phurine, glucoseu, hgbur, bilirubinur, ketonesur, proteinur, urobilinogen, nitrite, leukocytesur    STUDIES: Most recent mammogram March 2013 was unremarkable  ASSESSMENT: 51 y.o. [place] woman status post right lumpectomy and sentinel lymph node biopsy in 06/2007 for a T1c, N0, grade 3 invasive ductal carcinoma, ER/PR positive, HER-2/neu negative with MIB-1 of 24%. Status post radiation therapy. On tamoxifen since 09/2007. Participated in the [id number] protocol, and was found to be an extensive metabolizer.   PLAN: She tells me she is not seeing Dr. Audie Box regularly, but I asked her to make an appointment with them because of course tamoxifen can has been associated with an increased risk of cancer of the uterus, and I can't tell if she is having just breakthrough bleeding because of perimenopause or because of a uterine polyp or other growth. She tells me she will make this appointment within the next month  Otherwise we discussed her options which include continuing tamoxifen for another 5 years, or switching to an aromatase inhibitor and taking that for 5 years. Either of those options would further reduce her already low risk of recurrence by 2-3%.   She is very eager to get off the tamoxifen because she feels this will free her pain clinic doctors to try other drugs that might otherwise interact with tamoxifen. She is not interested in the aromatase inhibitors because they can cause fibromyalgia-like symptoms. What she would like to do is stop the tamoxifen and discontinue followup here, and I am not on comfortable with that decision  Accordingly we have not made further followup visits here for carmine, but she knows to call for any problems that may develop up that she thinks may be related to her history of breast cancer.  Makeila Yamaguchi C     10/18/2012

## 2012-10-19 ENCOUNTER — Telehealth: Payer: Self-pay | Admitting: Oncology

## 2012-10-19 NOTE — Telephone Encounter (Signed)
Sent letter to pt. From Dr. Magrinat °

## 2012-10-21 ENCOUNTER — Other Ambulatory Visit: Payer: Self-pay | Admitting: Physician Assistant

## 2012-10-21 ENCOUNTER — Other Ambulatory Visit (HOSPITAL_COMMUNITY)
Admission: RE | Admit: 2012-10-21 | Discharge: 2012-10-21 | Disposition: A | Discharge status: Home / Self Care | Payer: Medicare Other | Source: Ambulatory Visit | Attending: Family Medicine | Admitting: Family Medicine

## 2012-10-21 DIAGNOSIS — Z124 Encounter for screening for malignant neoplasm of cervix: Secondary | ICD-10-CM | POA: Insufficient documentation

## 2012-12-03 ENCOUNTER — Encounter: Payer: Self-pay | Admitting: Gynecology

## 2012-12-03 ENCOUNTER — Ambulatory Visit (INDEPENDENT_AMBULATORY_CARE_PROVIDER_SITE_OTHER): Payer: Medicare Other | Admitting: Gynecology

## 2012-12-03 VITALS — BP 126/76

## 2012-12-03 DIAGNOSIS — B373 Candidiasis of vulva and vagina: Secondary | ICD-10-CM

## 2012-12-03 DIAGNOSIS — N393 Stress incontinence (female) (male): Secondary | ICD-10-CM

## 2012-12-03 DIAGNOSIS — N898 Other specified noninflammatory disorders of vagina: Secondary | ICD-10-CM

## 2012-12-03 DIAGNOSIS — N926 Irregular menstruation, unspecified: Secondary | ICD-10-CM

## 2012-12-03 LAB — WET PREP FOR TRICH, YEAST, CLUE
Clue Cells Wet Prep HPF POC: NONE SEEN
Trich, Wet Prep: NONE SEEN

## 2012-12-03 MED ORDER — FLUCONAZOLE 200 MG PO TABS: 200.0000 mg | ORAL_TABLET | Freq: Every day | ORAL | Status: DC

## 2012-12-03 NOTE — Patient Instructions (Signed)
Follow up for ultrasound. 

## 2012-12-03 NOTE — Progress Notes (Signed)
Patient presents having recently finished her 5 year course of tamoxifen reportedly having menses once annually over the last several years. She has no other bleeding but a menstrual-like bleed the last episode in August. Dr. Darnelle Catalan her oncologist asked her to see me given her history of tamoxifen and this bleeding. She is having some hot flashes and sweats although reports having a recent [facility] in October check through her primary physician's office which was normal and also had a normal thyroid study. She sees her primary physician routinely for general GYN care and had a normal Pap smear in October. Also had a negative follow up mammogram March 2013. Patient is complaining of some vaginal irritation. She did take a Diflucan about a month ago that she had but has noticed some irritation. She also notes some stress urinary incontinence with laughing coughing sneezing.   Exam was Sherrilyn Rist Asst. Abdomen soft nontender without masses guarding rebound organomegaly. Pelvic external BUS vagina grossly normal with slight white discharge. Cervix normal. Uterus normal size midline mobile nontender. Adnexa without masses or tenderness.  Assessment and plan: 1. Vaginal irritation and discharge. Wet prep positive for yeast. We'll treat with Diflucan 200 mg daily x2 days. Total #5 given to have available for future recurrences. 2. SUI symptoms. Check U A. Reviewed Kegel exercises with her. Her exam otherwise is normal showing good bladder support. If continues or worsens will consider referral to urology for sling discussion. 3. Perimenopausal bleeding. Yearly menses. We'll recheck [facility] now and start with ultrasound for endometrial echo possible sonohysterogram and sampling. Patient will schedule follow up with me for this.

## 2012-12-04 LAB — FOLLICLE STIMULATING HORMONE: FSH: 29 m[IU]/mL

## 2012-12-17 ENCOUNTER — Other Ambulatory Visit: Payer: Self-pay | Admitting: Gynecology

## 2012-12-17 ENCOUNTER — Ambulatory Visit (INDEPENDENT_AMBULATORY_CARE_PROVIDER_SITE_OTHER): Payer: Medicare Other

## 2012-12-17 ENCOUNTER — Other Ambulatory Visit: Payer: Medicare Other

## 2012-12-17 ENCOUNTER — Ambulatory Visit (INDEPENDENT_AMBULATORY_CARE_PROVIDER_SITE_OTHER): Payer: Medicare Other | Admitting: Gynecology

## 2012-12-17 ENCOUNTER — Encounter: Payer: Self-pay | Admitting: Gynecology

## 2012-12-17 ENCOUNTER — Ambulatory Visit: Payer: Medicare Other | Admitting: Gynecology

## 2012-12-17 DIAGNOSIS — N924 Excessive bleeding in the premenopausal period: Secondary | ICD-10-CM

## 2012-12-17 DIAGNOSIS — L293 Anogenital pruritus, unspecified: Secondary | ICD-10-CM

## 2012-12-17 DIAGNOSIS — C50919 Malignant neoplasm of unspecified site of unspecified female breast: Secondary | ICD-10-CM

## 2012-12-17 DIAGNOSIS — N926 Irregular menstruation, unspecified: Secondary | ICD-10-CM

## 2012-12-17 DIAGNOSIS — R059 Cough, unspecified: Secondary | ICD-10-CM

## 2012-12-17 DIAGNOSIS — R05 Cough: Secondary | ICD-10-CM

## 2012-12-17 DIAGNOSIS — L292 Pruritus vulvae: Secondary | ICD-10-CM

## 2012-12-17 DIAGNOSIS — N882 Stricture and stenosis of cervix uteri: Secondary | ICD-10-CM

## 2012-12-17 MED ORDER — BENZONATATE 200 MG PO CAPS: 200.0000 mg | ORAL_CAPSULE | Freq: Three times a day (TID) | ORAL | Status: DC | PRN

## 2012-12-17 MED ORDER — LIDOCAINE HCL (PF) 1 % IJ SOLN
12.0000 mL | Freq: Once | INTRAMUSCULAR | Status: AC
  Administered 2012-12-17: 12 mL

## 2012-12-17 MED ORDER — NYSTATIN-TRIAMCINOLONE 100000-0.1 UNIT/GM-% EX OINT: TOPICAL_OINTMENT | Freq: Two times a day (BID) | CUTANEOUS | Status: DC

## 2012-12-17 NOTE — Progress Notes (Signed)
Patient presents for sonohysterogram with history of having recently finished her 5 year course of tamoxifen reportedly having menses once annually over the last several years. She has no other bleeding but a menstrual-like bleed the last episode in August. Dr. Darnelle Catalan her oncologist asked her to see me given her history of tamoxifen and this bleeding. She is having some hot flashes and sweats with recent FSH 29. Patient also is complaining of a nagging cough. It seems to be getting better overall but bothers her at bedtime. Lastly she is having some vulvar itching.  History with Diflucan at her recent visit but still has some external itching.  Ultrasound shows uterus normal in size with one small myoma 10 mm noted. Endometrial echo of 5.3 mm. Right left ovaries visualized and normal. Cul-de-sac negative. Cervical stenosis was noted and a paracervical block using 1% lidocaine 10 cc total placed. Single-tooth tenaculum anterior lip stabilization with cervical dilatation and subsequent sonohysterogram catheter placement good distention no abnormalities. Endometrial sample taken. Patient tolerated well.  Assessment and plan: 1. Perimenopausal bleeding. FSH marginal and I suspect she is still having an occasional menses. Recent cessation of her tamoxifen treatment. Ultrasound negative. We'll follow up for biopsy results. Assuming negative then plan expectant management. I suspect she may have still occasional menses and she'll monitor as long as overall regular in flow will follow. If prolonged very typical bleeding or goes more than one year without bleeding and then bleeds, she will alert [place]. 2. Cough. Exam today HEENT normal. Lungs clear. Suspect bronchitis which is improving historically. We'll treat symptomatically with pushing fluids and Tessalon Perles 200 mg every 8 hour when necessary #20. If persists worsens or changes represent for evaluation. 3. Vulvar itching. Recent treatment with Diflucan. We'll  treat with Mytrex cream externally twice daily as needed.

## 2012-12-17 NOTE — Patient Instructions (Addendum)
Office will call you with the biopsy results. Report any further bleeding. Follow up if coughing persists or worsens. Follow up if vulvar itching persists.

## 2012-12-20 ENCOUNTER — Telehealth: Payer: Self-pay | Admitting: Gynecology

## 2012-12-20 NOTE — Telephone Encounter (Signed)
Patient was informed biopsy was benign consistent with menopausal changes.  Patient asked does she need to schedule bone density test?  She said she has never had one.

## 2012-12-20 NOTE — Telephone Encounter (Signed)
I think that she can wait a little further into the menopause. Tamoxifen was good the bones which would be of benefit. I would suggest at age 51

## 2012-12-21 NOTE — Telephone Encounter (Signed)
Patient informed. 

## 2012-12-24 ENCOUNTER — Ambulatory Visit: Payer: Medicare Other | Admitting: Gynecology

## 2012-12-24 ENCOUNTER — Other Ambulatory Visit: Payer: Medicare Other

## 2013-01-05 ENCOUNTER — Other Ambulatory Visit: Payer: Medicare Other

## 2013-01-05 ENCOUNTER — Ambulatory Visit: Payer: Medicare Other | Admitting: Gynecology

## 2013-02-05 ENCOUNTER — Other Ambulatory Visit: Payer: Self-pay

## 2013-02-24 ENCOUNTER — Other Ambulatory Visit: Payer: Self-pay | Admitting: Gynecology

## 2013-02-24 ENCOUNTER — Telehealth: Payer: Self-pay

## 2013-02-24 MED ORDER — MEGESTROL ACETATE 20 MG PO TABS: 20.0000 mg | ORAL_TABLET | Freq: Every day | ORAL | Status: DC

## 2013-02-24 NOTE — Telephone Encounter (Signed)
Patient is in [place] Father passed away in 01-07-23 and she will be there until June or July assisting her mother.  She said that after the [facility] in December she was fine.  However, since January 22 she has bled like a period everyday.  Some times with tiny clots and low back pain. Sometimes nausea. No fever. Has not been sexually active.   She is asking if you can help her from here so that she does not have to see a gyn there.  She said if you can call something in to her local [facility] here that her [facility] in [place] can obtain it.

## 2013-02-24 NOTE — Telephone Encounter (Signed)
Patient informed and Rx e-scribed to pharmacy.

## 2013-02-24 NOTE — Telephone Encounter (Signed)
Megace 20 mg daily x10 days. Remind her that this is a progesterone type hormone and in patients with breast cancer we want to avoid hormones but I do not think a short 10 day course as an issue.  This hopefully should stop her bleeding

## 2013-03-03 ENCOUNTER — Telehealth: Payer: Self-pay

## 2013-03-03 NOTE — Telephone Encounter (Signed)
Lonie called in my voice mail to say thank you for taking care of her from so far away.  She said the Megace has stopped her bleeding and she feels so much better.  Said she misses [place] all and just wanted to say thank you.

## 2013-03-14 ENCOUNTER — Telehealth: Payer: Self-pay | Admitting: Gynecology

## 2013-03-14 NOTE — Telephone Encounter (Signed)
Patient called over the weekend and complaining of dysfunction uterine bleeding tiredness fatigue. She's currently in [place] . Patient is status post right lumpectomy and sentinel lymph node biopsy in 06/2007 for a T1c, N0, grade 3 invasive ductal carcinoma, ER/PR positive.in the end of 2013 she completed her 5 years of tamoxifen.she had a normal sonohysterogram and endometrial biopsy in December 2013 because of irregular bleeding had been prescribed Megace and she stated when she ran out she began bleeding again this is the reason for full call.  It encourage her to be seen in a medical facility in [place] if she feels lethargic weak tired and fatigued because of her blood loss. She states that her symptoms have not gotten had severe and if I would call her in for some Megace. I will prescribe Megace 40 mg twice a day for the next 3 days then she can take 1 daily to complete 2 weeks of treatment until she is seen in in [place] or if she gets back [place] states.

## 2013-03-18 ENCOUNTER — Telehealth: Payer: Self-pay | Admitting: *Deleted

## 2013-03-18 NOTE — Telephone Encounter (Signed)
Pt is calling to follow up with telephone encounter 03/14/13. Pt said she has stopped bleeding since taking the megace 40 mg tablets. She was seen in [place] had U/S done it was normal, CMP lab normal and hemoglobin 12.2. She asked if okay to stop taking megace because it is making her feel dizzy. I told her what your note said " Megace 40 mg twice a day for the next 3 days then she can take 1 daily to complete 2 weeks of treatment until she is seen in in [place] or if she gets back to [place] states". Pt still asked me to relay message to you. Please advise

## 2013-03-18 NOTE — Telephone Encounter (Signed)
I explained the below note to patient, she is taking a multivitamin and she declined to take a iron tablet daily, she will make OV with TF once [place].

## 2013-03-18 NOTE — Telephone Encounter (Signed)
She can stop the Megace if her bleeding stopped since it is causing her to feel dizzy. Her hemoglobin was in the low range of normal I would recommend she start taking 1 iron tablet daily if she is not doing so already. She will need to be seen in the office when [place] Dr. Audie Box.

## 2013-03-30 ENCOUNTER — Encounter: Payer: Self-pay | Admitting: Gynecology

## 2013-04-06 ENCOUNTER — Other Ambulatory Visit: Payer: Self-pay | Admitting: Oncology

## 2013-04-06 DIAGNOSIS — Z853 Personal history of malignant neoplasm of breast: Secondary | ICD-10-CM

## 2013-04-07 ENCOUNTER — Ambulatory Visit (INDEPENDENT_AMBULATORY_CARE_PROVIDER_SITE_OTHER): Payer: Medicare Other | Admitting: Gynecology

## 2013-04-07 ENCOUNTER — Encounter: Payer: Self-pay | Admitting: Gynecology

## 2013-04-07 DIAGNOSIS — N926 Irregular menstruation, unspecified: Secondary | ICD-10-CM

## 2013-04-07 DIAGNOSIS — C50911 Malignant neoplasm of unspecified site of right female breast: Secondary | ICD-10-CM

## 2013-04-07 DIAGNOSIS — N644 Mastodynia: Secondary | ICD-10-CM

## 2013-04-07 DIAGNOSIS — C50919 Malignant neoplasm of unspecified site of unspecified female breast: Secondary | ICD-10-CM

## 2013-04-07 DIAGNOSIS — N951 Menopausal and female climacteric states: Secondary | ICD-10-CM

## 2013-04-07 NOTE — Patient Instructions (Signed)
Report any significant abnormal bleeding. Call if any issues.

## 2013-04-07 NOTE — Progress Notes (Signed)
Patient presents with 2 issues. The first is left breast tenderness. Started several weeks ago. Was time to an episode of bleeding and she said it felt like it did when she was having her periods right before they started. No nipple discharge masses or other findings on self exam. Also notes over the past year bleeding on and off. Had been recently treated with Megace to stop any bleeding while she was in [place] Head ultrasound there which showed normal right ovary. Left ovary not visualized. Endometrial echo 6 mm and her uterus overall normal size. She had a FSH of 29 in December 2013 and had a negative sonohysterogram with biopsy showing inactive endometrium.  Exam with Berenice Bouton Both breasts examined lying and sitting. Left without masses retractions discharge adenopathy. Right status post lumpectomy well-healed scars with some retraction of her nipple. No masses or adenopathy. Pelvic  external BUS vagina normal. Cervix normal. Uterus normal size and a mobile nontender. Adnexa without masses or tenderness.  Assessment and plan: Perimenopausal irregular bleeding. Transiently treated with Megace. I reviewed with her given her history of breast cancer and receptor positive status the I'm uncomfortable with any long-term hormonal treatments to regulate her bleeding. She understands potential for stimulating disease with hormone use. Recent evaluation to include biopsy showed a thin inactive endometrium. FSH is borderline. I think this is all perimenopausal occasional ovulatory bleeding. This attributes for her breast tenderness also. She does have a mammogram scheduled next month and will follow up for this. Assuming the symptoms of tenderness resolved and her self-exam remains normal and we'll follow. At this point recommend following her bleeding pattern she is not bleeding now and then we'll go from there. She's comfortable with expectant management.

## 2013-04-18 ENCOUNTER — Ambulatory Visit: Payer: Medicare Other

## 2013-04-28 ENCOUNTER — Ambulatory Visit
Admission: RE | Admit: 2013-04-28 | Discharge: 2013-04-28 | Disposition: A | Discharge status: Home / Self Care | Payer: Medicare Other | Source: Ambulatory Visit | Attending: Oncology | Admitting: Oncology

## 2013-04-28 DIAGNOSIS — Z853 Personal history of malignant neoplasm of breast: Secondary | ICD-10-CM

## 2013-07-08 ENCOUNTER — Encounter: Payer: Self-pay | Admitting: Neurology

## 2013-07-08 ENCOUNTER — Ambulatory Visit (INDEPENDENT_AMBULATORY_CARE_PROVIDER_SITE_OTHER): Payer: Medicare Other | Admitting: Neurology

## 2013-07-08 VITALS — BP 100/70 | HR 84 | Temp 98.5°F | Ht 64.75 in | Wt 170.0 lb

## 2013-07-08 DIAGNOSIS — R51 Headache: Secondary | ICD-10-CM

## 2013-07-08 NOTE — Progress Notes (Signed)
NEUROLOGY CONSULTATION NOTE  Kiara Wilson MRN: [id number] DOB: 1961-04-28   Referring provider: Ms. Haynes Dage Primary care provider: Ms. Haynes Dage  Reason for consult:  Facial pain.  Questionable trigeminal neuralgia  HISTORY OF PRESENT ILLNESS: Kiara Wilson is a 52 y.o. 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 Symptoms started approximately a couple months ago.  She has developed bilateral facial pain that is described as a constant aching, and associated with bitemporal head pounding. She also notes pain on the floor of her mouth and that it's painful when she touches the tip of her tongue to the floor of her mouth. She denies any paroxysmal shooting pain that triggered by light sensations such as eating or brushing her teeth.  She denies any visual symptoms.  At times she notes a tingling sensation over her upper lip. 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 She notes occasional paresthesias and numbness of both hands and arms up to the forearms in a glove distribution. She notices it when she is in bed.  She did see a dentist who couldn't find anything wrong. She was then referred 2 ENT, who suggested possible TMJ dysfunction.  She then saw an oral surgeon who suggested that her symptoms are likely secondary to stress her fibromyalgia.  She takes Advil for the pain.  She takes Tramadol for her fibromyalgia. In the past, she has taken gabapentin which cause side effects. She is taking Lyrica in the past which caused leg edema. She has also been on Cymbalta in the past, which was ineffective.  Labs: TSH 0.98, ANA negative, B12 375, Vitamin D 33.1  PAST MEDICAL HISTORY: Past Medical History  Diagnosis Date  . Breast cancer 09/2007  . Migraines   .  Fibromyalgia   . Hypertension   . Overactive bladder     PAST SURGICAL HISTORY: Past Surgical History  Procedure Laterality Date  . Pelvic laparoscopy  1992  . Eye surgery    . Breast lumpectomy      right    MEDICATIONS: Current Outpatient Prescriptions on File Prior to Visit  Medication Sig Dispense Refill  . Cetirizine HCl (ZYRTEC ALLERGY PO) Take by mouth.      . citalopram (CELEXA) 20 MG tablet Take 20 mg by mouth daily.      . clonazePAM (KLONOPIN) 1 MG tablet Take 1 mg by mouth 2 (two) times daily as needed.        . docusate sodium (COLACE) 100 MG capsule Take 100 mg by mouth 2 (two) times daily as needed.        Marland Kitchen losartan (COZAAR) 50 MG tablet Take 50 mg by mouth daily.        . mometasone (NASONEX) 50 MCG/ACT nasal spray Place 2 sprays into the nose daily.      Marland Kitchen nystatin-triamcinolone ointment (MYCOLOG) Apply topically 2 (two) times daily.  30 g  0  . traMADol (ULTRAM) 50 MG tablet Take 50 mg by mouth every 6 (six) hours as needed. Maximum dose= 8 tablets per day       . carisoprodol (SOMA) 350 MG tablet Take 350 mg by mouth as needed.        . megestrol (MEGACE) 20 MG tablet Take 1 tablet (20 mg total) by mouth daily.  10  tablet  0   No current facility-administered medications on file prior to visit.    ALLERGIES: Allergies  Allergen Reactions  . Amoxicillin     FAMILY HISTORY: Family History  Problem Relation Age of Onset  . Diabetes Maternal Grandmother   . Diabetes Paternal Grandmother     SOCIAL HISTORY: History   Social History  . Marital Status: Married    Spouse Name: N/A    Number of Children: N/A  . Years of Education: N/A   Occupational History  . Not on file.   Social History Main Topics  . Smoking status: Never Smoker   . Smokeless tobacco: Never Used  . Alcohol Use: No     Comment: rare  . Drug Use: No  . Sexually Active: Yes    Birth Control/ Protection: Post-menopausal   Other Topics Concern  . Not on file   Social  History Narrative  . No narrative on file    REVIEW OF SYSTEMS: Constitutional: No fevers, chills, or sweats, no generalized fatigue, change in appetite Eyes: No visual changes, double vision, eye pain Ear, nose and throat: No hearing loss, ear pain, nasal congestion, sore throat Cardiovascular: No chest pain, palpitations Respiratory:  No shortness of breath at rest or with exertion, wheezes GastrointestinaI: No nausea, vomiting, diarrhea, abdominal pain, fecal incontinence Genitourinary:  No dysuria, urinary retention or frequency Musculoskeletal:  Neck pain, back pain Integumentary: No rash, pruritus, skin lesions Neurological: as above Psychiatric: No depression, insomnia, anxiety Endocrine: No palpitations, fatigue, diaphoresis, mood swings, change in appetite, change in weight, increased thirst Hematologic/Lymphatic:  No anemia, purpura, petechiae. Allergic/Immunologic: no itchy/runny eyes, nasal congestion, recent allergic reactions, rashes  PHYSICAL EXAM: Filed Vitals:   07/08/13 1350  BP: 100/70  Pulse: 84  Temp: 98.5 F (36.9 C)   General: No acute distress Head:  Normocephalic/atraumatic Neck: supple, no paraspinal tenderness, full range of motion Back: No paraspinal tenderness Heart: regular rate and rhythm Lungs: Clear to auscultation bilaterally. Vascular: No carotid bruits. Neurological Exam: Mental status: alert and oriented to person, place, time and self, speech fluent and not dysarthric, language intact. Cranial nerves: CN I: not tested CN II: pupils equal, round and reactive to light, visual fields intact, fundi unremarkable. CN III, IV, VI:  full range of motion, no nystagmus, no ptosis CN V: facial sensation intact CN VII: upper and lower face symmetric CN VIII: hearing intact CN IX, X: gag intact, uvula midline CN XI: sternocleidomastoid and trapezius muscles intact CN XII: tongue midline Bulk & Tone: normal, no fasciculations. Motor: 5/5  throughout Sensation: temperature and vibration intact Deep Tendon Reflexes: 2+ throughout, toes downgoing Finger to nose testing: normal Gait: normal stance and stride, able to walk and tandem. Romberg negative.  IMPRESSION & PLAN: Kiara Wilson is a 52 y.o. female with facial and mouth pain. Symptoms do not seem consistent with trigeminal neuralgia. The quality of pain she describes as not seeing neuropathic. Neurological exam is normal.  This may be simply a manifestation of her fibromyalgia.at this point, I feel that optimizing her pain regimen would be appropriate. She exhibits no focal findings to warrant further tests such as imaging.  45 minutes spent with the patient, over 50% spent counseling and coordinating care.  Thank you for allowing me to take part in the care of this patient.  Shon Millet, DO  CC: Ricci Barker, PA-C

## 2013-07-08 NOTE — Patient Instructions (Addendum)
I don't think it is trigeminal neuralgia or anything from the brain.  You may need to optimize pain control.

## 2013-09-27 ENCOUNTER — Telehealth: Payer: Self-pay | Admitting: *Deleted

## 2013-09-27 NOTE — Telephone Encounter (Signed)
Message left by pt stating she has found a new lump in her right breast close to the scar.  This RN returned call to pt for further inquiry.  Kiara Wilson states she noticed lump yesterday. The lump is about marble size - " irregular and doesn't move". Pt states area has increased soreness.  Last mammogram was in April of this year.  This note will be reviewed with MD for appropriate follow up.  Return call number given as [phone].

## 2013-09-27 NOTE — Telephone Encounter (Signed)
Per MD review - request was for pt to " stop in" for MD to check area for appropriate follow up.  This RN called pt and discussed above.  She will come in 10/8 post 2 pm and due to this RN off day will ask for Amy Clovis Riley RN.

## 2013-09-28 ENCOUNTER — Telehealth: Payer: Self-pay | Admitting: *Deleted

## 2013-09-28 ENCOUNTER — Ambulatory Visit (HOSPITAL_BASED_OUTPATIENT_CLINIC_OR_DEPARTMENT_OTHER): Payer: Medicare Other | Admitting: Oncology

## 2013-09-28 VITALS — BP 127/84 | HR 103 | Temp 98.9°F | Resp 18 | Ht 64.0 in | Wt 173.2 lb

## 2013-09-28 DIAGNOSIS — N644 Mastodynia: Secondary | ICD-10-CM

## 2013-09-28 DIAGNOSIS — Z853 Personal history of malignant neoplasm of breast: Secondary | ICD-10-CM

## 2013-09-28 DIAGNOSIS — C50911 Malignant neoplasm of unspecified site of right female breast: Secondary | ICD-10-CM

## 2013-09-28 NOTE — Telephone Encounter (Signed)
appts made and printed...td 

## 2013-09-28 NOTE — Progress Notes (Signed)
ID: Kiara Wilson   DOB: [birth year]  MR#: [id number]  NGE#:[id number]  PCP: Kiara Amy PA. Kiara Wilson [facility]: Kiara Wilson SU: P Kiara Wilson OTHER MD: Kiara Wilson, Kiara Wilson   HISTORY OF PRESENT ILLNESS: The patient palpated a change in her right breast late 2007.  She brought this to Dr. Reynold Wilson attention and he according to the patient attempted an aspiration, but "nothing came out," so she was sent for mammography, which was performed in December of 2007.  I do not have that particular report, but it was felt to be essentially unremarkable and the patient was scheduled for repeat screening mammography in May of 2008.  This was performed May 20 and showed, in addition to a very dense breast, a possible mass in the right breast.  The patient was called back for further studies, May 22, and this showed no palpable mass by exam.  Minimal distortion on the mammography; however, on the ultrasound there was an ill-defined hypoechoic area measuring up to 1.2 cm.  The previously aspirated cyst was seen only as a small remnant in that location.  It was not clear whether this mass represented fibrosis/scarring, or a new development.  Accordingly, a core needle biopsy was performed on the same day and showed (Kiara Wilson and Kiara Wilson) high-grade ductal carcinoma in situ, which was strongly ER and PR positive.  With this information, the patient was referred to Dr. Carolynne Wilson and bilateral breast MRIs were obtained May 29.  This showed an area of clumped nodular enhancement in the right breast measuring up to 3.8 cm, but additionally in the left breast there was a 2.6 cm area, which appeared suspicious.  Accordingly, a left breast MRI-guided biopsy was performed on June 12.  This showed (Kiara Wilson) only fibrocystic changes.  Accordingly, on July 09, 2007 the patient underwent right lumpectomy and sentinel lymph node biopsy and the final pathology (Kiara Wilson and Kiara Wilson) showed a 1.3 cm invasive ductal carcinoma  in the setting of 3.9 cm DCIS.  The invasive tumor was grade 3.  There was no evidence of lymphovascular invasion, margins were negative and 0 of 5 sentinel lymph nodes were involved.  A prognostic panel on the invasive tumor showed it to be 57% ER positive, 53% PR positive, 24% by Ki-67 and negative on the HercepTest at 1+.  INTERVAL HISTORY: Kiara Wilson returns today for an unscheduled visit. She had "graduate" from breast cancer followup here. However recently she has developed pain in the right breast and is noticing a change in the area near the scar. There has been no nipple discharge. She called to make an appointment for evaluation  REVIEW OF SYSTEMS: Aside from the discomfort in the right breast, upper outer quadrant, a detailed review of systems, while positive, seems unrelated. She sleeps poorly. She has occasional chills, more than hot flashes. She has palpitations, which have been evaluated by her primary care physician. She keeps a dry cough and is short of breath when climbing stairs or walking up a slope. She has chronic back and joint pain associated with fibromyalgia. She feels forgetful, anxious, and depressed. Otherwise a detailed review of systems today was noncontributory.  PAST MEDICAL HISTORY: Past Medical History  Diagnosis Date  . Breast cancer 09/2007  . Migraines   . Fibromyalgia   . Hypertension   . Overactive bladder    Significant for the patient being status post laparoscopy in the 1990s for what proved to be adhesions, status post radial keratotomy remotely with a history  of fibromyalgia diagnosed in the 1990s and followed at one time by Dr. Jimmy Wilson, but she no longer sees him.  She was followed briefly by Dr. Thyra Wilson and she is not followed there either at this point.  She has a history of migraines approximately two years ago, but they have not recurred.  She has a hiatal hernia with mild GERD symptoms  PAST SURGICAL HISTORY: Past Surgical History  Procedure  Laterality Date  . Pelvic laparoscopy  1992  . Eye surgery    . Breast lumpectomy      right    FAMILY HISTORY Family History  Problem Relation Age of Onset  . Diabetes Maternal Grandmother   . Diabetes Paternal Grandmother    The patient's parents are both still living, but in poor health.  Her mother has rheumatoid arthritis.  Her father has osteoarthritis.  She has three sisters and two brothers.  There is no history of breast or ovarian cancer in the family.    GYNECOLOGIC HISTORY: GX P0; she appears to be. Menopausal, with a. September of 2013, but no periods for about 10 months before that.  SOCIAL HISTORY: The patient worked at [employer] as a Engineer, site, but is now disabled.  Her husband Kiara Wilson works for [employer].  The patient is originally from [place]  Kiara Wilson is also of a Hispanic parentage, although he is from [place].  They are both Jehovah Witnesses.   ADVANCED DIRECTIVES: The patient tells me she would not want any red cell blood products under any circumstances including life saving circumstances, but she might be able to accept platelets of plasma or other components that do not involve red cells (and probably also not white cells), but issue would have to be discussed with her or her health care power of attorney, who is Kiara Wilson.  Those documents are being separately scanned.    HEALTH MAINTENANCE: History  Substance Use Topics  . Smoking status: Never Smoker   . Smokeless tobacco: Never Used  . Alcohol Use: No     Comment: rare     Colonoscopy:  PAP:  Bone density:  Lipid panel:  Allergies  Allergen Reactions  . Amoxicillin     Current Outpatient Prescriptions  Medication Sig Dispense Refill  . carisoprodol (SOMA) 350 MG tablet Take 350 mg by mouth as needed.        . Cetirizine HCl (ZYRTEC ALLERGY PO) Take by mouth.      . citalopram (CELEXA) 20 MG tablet Take 20 mg by mouth daily.      . clonazePAM (KLONOPIN) 1 MG  tablet Take 1 mg by mouth 2 (two) times daily as needed.        . docusate sodium (COLACE) 100 MG capsule Take 100 mg by mouth 2 (two) times daily as needed.        Marland Kitchen losartan (COZAAR) 50 MG tablet Take 50 mg by mouth daily.        . megestrol (MEGACE) 20 MG tablet Take 1 tablet (20 mg total) by mouth daily.  10 tablet  0  . mometasone (NASONEX) 50 MCG/ACT nasal spray Place 2 sprays into the nose daily.      . Multiple Vitamin (MULTIVITAMIN) tablet Take 1 tablet by mouth daily.      Marland Kitchen nystatin-triamcinolone ointment (MYCOLOG) Apply topically 2 (two) times daily.  30 g  0  . traMADol (ULTRAM) 50 MG tablet Take 50 mg by mouth every 6 (six) hours as  needed. Maximum dose= 8 tablets per day        No current facility-administered medications for this visit.    OBJECTIVE: Middle-aged [place] woman in no acute distress Filed Vitals:   09/28/13 1427  BP: 127/84  Pulse: 103  Temp: 98.9 F (37.2 C)  Resp: 18     Body mass index is 29.72 kg/(m^2).    ECOG FS: 2  Sclerae unicteric, pupils equal round and reactive to light Oropharynx no thrush or lesions seen No cervical or supraclavicular adenopathy Lungs no rales or rhonchi Heart regular rate and rhythm Abd soft, nontender, positive bowel sounds MSK no focal spinal tenderness, no upper extremity lymphedema, Neuro: nonfocal, well oriented, appropriate affect Breasts: The right breast is status post lumpectomy and radiation. There is no well-defined mass. There is some area of irregularity in the superior and lateral aspect of the breast incision. A little more lateral and inferiorly to that, there is an area of focal pain. There is no erythema or swelling. The right axilla is benign. The left breast is unremarkable.   LAB RESULTS: Lab Results  Component Value Date   WBC 6.0 10/11/2012   NEUTROABS 3.7 10/11/2012   HGB 12.2 10/11/2012   HCT 36.2 10/11/2012   MCV 90.0 10/11/2012   PLT 301 10/11/2012      Chemistry      Component Value  Date/Time   NA 140 12/18/2011 1245   K 4.3 12/18/2011 1245   CL 106 12/18/2011 1245   CO2 27 12/18/2011 1245   BUN 13 12/18/2011 1245   CREATININE 0.84 12/18/2011 1245      Component Value Date/Time   CALCIUM 9.4 12/18/2011 1245   ALKPHOS 35* 12/18/2011 1245   AST 20 12/18/2011 1245   ALT 19 12/18/2011 1245   BILITOT 0.5 12/18/2011 1245       Lab Results  Component Value Date   LABCA2 14 12/18/2011    No components found with this basename: [id number]    No results found for this basename: INR,  in the last 168 hours  Urinalysis No results found for this basename: colorurine,  appearanceur,  labspec,  phurine,  glucoseu,  hgbur,  bilirubinur,  ketonesur,  proteinur,  urobilinogen,  nitrite,  leukocytesur    STUDIES: Most recent mammogram April 2014 was unremarkable  ASSESSMENT: 52 y.o. [place] woman status post right lumpectomy and sentinel lymph node biopsy in 06/2007 for a T1c, N0, grade 3 invasive ductal carcinoma, ER/PR positive, HER-2/neu negative with MIB-1 of 24%. Status post radiation therapy. On tamoxifen between 09/2007 and October 2013.Marland Kitchen Participated in the [id number] protocol, and was found to be an extensive metabolizer.   PLAN: I reassured carmine back pain in the breast is generally not related to breast cancer. She may have a blocked milk duct, or small area of inflammation. There is however focal tenderness. I think this does require evaluation and I am setting her out for mammography and ultrasonography of the right breast at the [facility] this week. He she does not get the results directly from them she will call me and he she does not when I get the results I will mail her a copy.  I have not made a return appointment for her here, but she does or will be glad to see her at any point in the future if the need arises    MAGRINAT,GUSTAV C    09/28/2013

## 2013-09-28 NOTE — Telephone Encounter (Signed)
Per [facility] pt is aware of appt d/t...td

## 2013-09-28 NOTE — Telephone Encounter (Signed)
Patient to come by today for further evaluation of right breast lump. Schedule approx 345 today. Patient aware

## 2013-10-05 ENCOUNTER — Ambulatory Visit
Admission: RE | Admit: 2013-10-05 | Discharge: 2013-10-05 | Disposition: A | Discharge status: Home / Self Care | Payer: Medicare Other | Source: Ambulatory Visit | Attending: Oncology | Admitting: Oncology

## 2013-10-05 DIAGNOSIS — C50911 Malignant neoplasm of unspecified site of right female breast: Secondary | ICD-10-CM

## 2013-10-19 ENCOUNTER — Other Ambulatory Visit: Payer: Self-pay | Admitting: Family Medicine

## 2013-10-19 DIAGNOSIS — R1011 Right upper quadrant pain: Secondary | ICD-10-CM

## 2013-10-21 ENCOUNTER — Ambulatory Visit
Admission: RE | Admit: 2013-10-21 | Discharge: 2013-10-21 | Disposition: A | Discharge status: Home / Self Care | Payer: Medicare Other | Source: Ambulatory Visit | Attending: Family Medicine | Admitting: Family Medicine

## 2013-10-21 DIAGNOSIS — R1011 Right upper quadrant pain: Secondary | ICD-10-CM

## 2013-10-27 ENCOUNTER — Other Ambulatory Visit: Payer: Self-pay

## 2013-11-24 ENCOUNTER — Other Ambulatory Visit: Payer: Self-pay | Admitting: Gastroenterology

## 2013-11-24 DIAGNOSIS — R1013 Epigastric pain: Secondary | ICD-10-CM

## 2013-11-24 DIAGNOSIS — R110 Nausea: Secondary | ICD-10-CM

## 2013-12-07 ENCOUNTER — Other Ambulatory Visit: Payer: Self-pay | Admitting: Physician Assistant

## 2013-12-07 ENCOUNTER — Other Ambulatory Visit (HOSPITAL_COMMUNITY)
Admission: RE | Admit: 2013-12-07 | Discharge: 2013-12-07 | Disposition: A | Discharge status: Home / Self Care | Payer: Medicare Other | Source: Ambulatory Visit | Attending: Family Medicine | Admitting: Family Medicine

## 2013-12-07 DIAGNOSIS — Z0000 Encounter for general adult medical examination without abnormal findings: Secondary | ICD-10-CM | POA: Insufficient documentation

## 2013-12-07 DIAGNOSIS — Z1151 Encounter for screening for human papillomavirus (HPV): Secondary | ICD-10-CM | POA: Insufficient documentation

## 2013-12-07 DIAGNOSIS — R87619 Unspecified abnormal cytological findings in specimens from cervix uteri: Secondary | ICD-10-CM | POA: Insufficient documentation

## 2013-12-12 ENCOUNTER — Other Ambulatory Visit (HOSPITAL_COMMUNITY): Payer: Medicare Other

## 2014-03-07 ENCOUNTER — Telehealth: Payer: Self-pay | Admitting: Neurology

## 2014-03-07 NOTE — Telephone Encounter (Signed)
As requested by Zeb Comfort at [facility], office note for 07/08/13 faxed to their office. They are the referring practice / Sherri S.

## 2014-05-23 ENCOUNTER — Other Ambulatory Visit: Payer: Self-pay | Admitting: Oncology

## 2014-05-23 DIAGNOSIS — Z853 Personal history of malignant neoplasm of breast: Secondary | ICD-10-CM

## 2014-05-23 DIAGNOSIS — Z9889 Other specified postprocedural states: Secondary | ICD-10-CM

## 2014-06-02 ENCOUNTER — Ambulatory Visit
Admission: RE | Admit: 2014-06-02 | Discharge: 2014-06-02 | Disposition: A | Discharge status: Home / Self Care | Payer: Medicare Other | Source: Ambulatory Visit | Attending: Oncology | Admitting: Oncology

## 2014-06-02 DIAGNOSIS — Z9889 Other specified postprocedural states: Secondary | ICD-10-CM

## 2014-06-02 DIAGNOSIS — Z853 Personal history of malignant neoplasm of breast: Secondary | ICD-10-CM

## 2014-06-13 ENCOUNTER — Ambulatory Visit (INDEPENDENT_AMBULATORY_CARE_PROVIDER_SITE_OTHER): Payer: Medicare Other

## 2014-06-13 ENCOUNTER — Ambulatory Visit (INDEPENDENT_AMBULATORY_CARE_PROVIDER_SITE_OTHER): Payer: Medicare Other | Admitting: Family Medicine

## 2014-06-13 VITALS — BP 110/70 | HR 90 | Temp 98.3°F | Resp 18 | Ht 64.0 in | Wt 169.0 lb

## 2014-06-13 DIAGNOSIS — M25579 Pain in unspecified ankle and joints of unspecified foot: Secondary | ICD-10-CM

## 2014-06-13 DIAGNOSIS — M25571 Pain in right ankle and joints of right foot: Secondary | ICD-10-CM

## 2014-06-13 DIAGNOSIS — T148XXA Other injury of unspecified body region, initial encounter: Secondary | ICD-10-CM

## 2014-06-13 MED ORDER — MUPIROCIN 2 % EX OINT: 1.0000 "application " | TOPICAL_OINTMENT | Freq: Two times a day (BID) | CUTANEOUS | Status: DC

## 2014-06-13 NOTE — Progress Notes (Signed)
Chief Complaint:  Chief Complaint  Patient presents with  . Foot Injury    right today     HPI: Kiara Wilson is a 53 y.o. female who is here for right foot paian d laceration s/p slipped while getting out of ppol and she fell into the pool. She hit her foot against cement and tore some of the skin, she also twisted her ankle Has not doen anything for this, no prior ankle or foot injuires.   Past Medical History  Diagnosis Date  . Breast cancer 09/2007  . Migraines   . Fibromyalgia   . Hypertension   . Overactive bladder   . Allergy   . Anxiety   . Arthritis   . Depression   . GERD (gastroesophageal reflux disease)    Past Surgical History  Procedure Laterality Date  . Pelvic laparoscopy  1992  . Eye surgery    . Breast lumpectomy      right   History   Social History  . Marital Status: Married    Spouse Name: N/A    Number of Children: N/A  . Years of Education: N/A   Social History Main Topics  . Smoking status: Never Smoker   . Smokeless tobacco: Never Used  . Alcohol Use: No     Comment: rare  . Drug Use: No  . Sexual Activity: Yes    Birth Control/ Protection: Post-menopausal   Other Topics Concern  . None   Social History Narrative  . None   Family History  Problem Relation Age of Onset  . Diabetes Maternal Grandmother   . Diabetes Paternal Grandmother    Allergies  Allergen Reactions  . Amoxicillin    Prior to Admission medications   Medication Sig Start Date End Date Taking? Authorizing Provider  BuPROPion HCl (WELLBUTRIN PO) Take 50 mg by mouth.   Yes Historical Provider, MD  carisoprodol (SOMA) 350 MG tablet Take 350 mg by mouth as needed.     Yes Historical Provider, MD  Cetirizine HCl (ZYRTEC ALLERGY PO) Take by mouth.   Yes Historical Provider, MD  citalopram (CELEXA) 20 MG tablet Take 20 mg by mouth daily.   Yes Historical Provider, MD  clonazePAM (KLONOPIN) 1 MG tablet Take 1 mg by mouth 2 (two) times daily as needed.      Yes Historical Provider, MD  losartan (COZAAR) 50 MG tablet Take 50 mg by mouth daily.     Yes Historical Provider, MD  Multiple Vitamin (MULTIVITAMIN) tablet Take 1 tablet by mouth daily.   Yes Historical Provider, MD  docusate sodium (COLACE) 100 MG capsule Take 100 mg by mouth 2 (two) times daily as needed.      Historical Provider, MD  megestrol (MEGACE) 20 MG tablet Take 1 tablet (20 mg total) by mouth daily. 02/24/13   Terrance Mass, MD  mometasone (NASONEX) 50 MCG/ACT nasal spray Place 2 sprays into the nose daily.    Historical Provider, MD  nystatin-triamcinolone ointment (MYCOLOG) Apply topically 2 (two) times daily. 12/17/12   Anastasio Auerbach, MD  traMADol (ULTRAM) 50 MG tablet Take 50 mg by mouth every 6 (six) hours as needed. Maximum dose= 8 tablets per day     Historical Provider, MD     ROS: The patient denies fevers, chills, night sweats, unintentional weight loss, chest pain, palpitations, wheezing, dyspnea on exertion, nausea, vomiting, abdominal pain, dysuria, hematuria, melena, numbness, weakness, or tingling.  All other systems have been  reviewed and were otherwise negative with the exception of those mentioned in the HPI and as above.    PHYSICAL EXAM: Filed Vitals:   06/13/14 1217  BP: 110/70  Pulse: 90  Temp: 98.3 F (36.8 C)  Resp: 18   Filed Vitals:   06/13/14 1217  Height: 5\' 4"  (1.626 m)  Weight: 169 lb (76.658 kg)   Body mass index is 28.99 kg/(m^2).  General: Alert, no acute distress HEENT:  Normocephalic, atraumatic, oropharynx patent. EOMI, PERRLA Cardiovascular:  Regular rate and rhythm, no rubs murmurs or gallops.  No Carotid bruits, radial pulse intact. No pedal edema.  Respiratory: Clear to auscultation bilaterally.  No wheezes, rales, or rhonchi.  No cyanosis, no use of accessory musculature GI: No organomegaly, abdomen is soft and non-tender, positive bowel sounds.  No masses. Skin: No rashes. Neurologic: Facial musculature  symmetric. Psychiatric: Patient is appropriate throughout our interaction. Lymphatic: No cervical lymphadenopathy Musculoskeletal: Gait intact. Right foot tender, ankle tender, lacerations, + 3rd and 4th toe abrasions, toe nail is intact  LABS: Results for orders placed in visit on 12/03/12  WET PREP FOR [place], YEAST, CLUE      Result Value Ref Range   Yeast Wet Prep HPF POC MANY (*) NONE SEEN   Trich, Wet Prep NONE SEEN  NONE SEEN   Clue Cells Wet Prep HPF POC NONE SEEN  NONE SEEN   WBC, Wet Prep HPF POC FEW  NONE SEEN  FOLLICLE STIMULATING HORMONE      Result Value Ref Range   FSH 29.0       EKG/XRAY:   Primary read interpreted by Dr. Marin Comment at [facility]. Neg fx/dislocation   ASSESSMENT/PLAN: Encounter Diagnoses  Name Primary?  . Pain in joint, ankle and foot, right Yes  . Abrasion   . Sprain and strain    Wound care was done in office, try as directed at home Post op shoe Buddy tape  Ace wrap ankle Rx bactroban for 3 days to prevent infection F/u prn  Gross sideeffects, risk and benefits, and alternatives of medications d/w patient. Patient is aware that all medications have potential sideeffects and we are unable to predict every sideeffect or drug-drug interaction that may occur.  LE, [place], DO 06/14/2014 2:20 PM

## 2014-12-11 ENCOUNTER — Other Ambulatory Visit: Payer: Self-pay | Admitting: Family Medicine

## 2014-12-11 ENCOUNTER — Other Ambulatory Visit (HOSPITAL_COMMUNITY)
Admission: RE | Admit: 2014-12-11 | Discharge: 2014-12-11 | Disposition: A | Discharge status: Home / Self Care | Payer: Medicare Other | Source: Ambulatory Visit | Attending: Family Medicine | Admitting: Family Medicine

## 2014-12-11 DIAGNOSIS — Z124 Encounter for screening for malignant neoplasm of cervix: Secondary | ICD-10-CM | POA: Diagnosis present

## 2014-12-12 LAB — CYTOLOGY - PAP

## 2015-04-30 ENCOUNTER — Telehealth: Payer: Self-pay | Admitting: *Deleted

## 2015-04-30 ENCOUNTER — Other Ambulatory Visit: Payer: Self-pay | Admitting: *Deleted

## 2015-04-30 ENCOUNTER — Telehealth: Payer: Self-pay | Admitting: Nurse Practitioner

## 2015-04-30 DIAGNOSIS — C50911 Malignant neoplasm of unspecified site of right female breast: Secondary | ICD-10-CM

## 2015-04-30 DIAGNOSIS — N63 Unspecified lump in unspecified breast: Secondary | ICD-10-CM

## 2015-04-30 NOTE — Telephone Encounter (Signed)
Patient confirmed appointment for the May.

## 2015-04-30 NOTE — Telephone Encounter (Signed)
Order obtained for mammo and U/S POF sent to request follow up MD appointment.

## 2015-04-30 NOTE — Telephone Encounter (Signed)
VM message from patient stating that she has found a new breast lump in her left breast and would like to see Dr. Jana Hakim about this. Has not had mammography since 05/2014. Please call patient.

## 2015-04-30 NOTE — Telephone Encounter (Addendum)
NEW LUMP IS IN LEFT BREAST AT 6 O'CLOCK. IT IS THE SIZE OF A PEA AND HAS BEEN PRESENT TWO WEEKS. THIS IS THE NON SURGICAL BREAST.

## 2015-04-30 NOTE — Telephone Encounter (Signed)
This RN returned call to pt per her VM- obtained identified VM for her.  Message left requesting a return call to [place] so additional information can be obtained- including where lump is, size of lump, How long present. Adjacent to any surgical areas?  Per MD once above data is obtained can order a diagnostic mammogram and U/S for evaluation and then follow up with Dr Jannifer Rodney ( note last appointment with MD was 2 years ago ).

## 2015-05-07 ENCOUNTER — Ambulatory Visit
Admission: RE | Admit: 2015-05-07 | Discharge: 2015-05-07 | Disposition: A | Discharge status: Home / Self Care | Payer: Medicare Other | Source: Ambulatory Visit | Attending: Oncology | Admitting: Oncology

## 2015-05-07 ENCOUNTER — Other Ambulatory Visit: Payer: Self-pay | Admitting: Oncology

## 2015-05-07 DIAGNOSIS — N63 Unspecified lump in unspecified breast: Secondary | ICD-10-CM

## 2015-05-07 DIAGNOSIS — C50911 Malignant neoplasm of unspecified site of right female breast: Secondary | ICD-10-CM

## 2015-05-09 ENCOUNTER — Other Ambulatory Visit: Payer: Self-pay | Admitting: *Deleted

## 2015-05-09 ENCOUNTER — Ambulatory Visit (INDEPENDENT_AMBULATORY_CARE_PROVIDER_SITE_OTHER): Payer: Medicare Other

## 2015-05-09 ENCOUNTER — Encounter: Payer: Self-pay | Admitting: Podiatry

## 2015-05-09 ENCOUNTER — Ambulatory Visit (INDEPENDENT_AMBULATORY_CARE_PROVIDER_SITE_OTHER): Payer: Medicare Other | Admitting: Podiatry

## 2015-05-09 VITALS — BP 134/88 | HR 92 | Resp 15

## 2015-05-09 DIAGNOSIS — C50911 Malignant neoplasm of unspecified site of right female breast: Secondary | ICD-10-CM

## 2015-05-09 DIAGNOSIS — M722 Plantar fascial fibromatosis: Secondary | ICD-10-CM

## 2015-05-09 MED ORDER — DICLOFENAC SODIUM 75 MG PO TBEC: 75.0000 mg | DELAYED_RELEASE_TABLET | Freq: Two times a day (BID) | ORAL | Status: DC

## 2015-05-09 MED ORDER — TRIAMCINOLONE ACETONIDE 10 MG/ML IJ SUSP
10.0000 mg | Freq: Once | INTRAMUSCULAR | Status: AC
  Administered 2015-05-09: 10 mg

## 2015-05-09 NOTE — Patient Instructions (Signed)

## 2015-05-09 NOTE — Progress Notes (Signed)
   Subjective:    Patient ID: Kiara Wilson, female    DOB: [birth year], 54 y.o.   MRN: [id number]  HPI Pt presents with left foot plantar fascial pain, has tried soaks, ice and NSAIDS with no relief   Review of Systems  All other systems reviewed and are negative.      Objective:   Physical Exam        Assessment & Plan:

## 2015-05-10 ENCOUNTER — Other Ambulatory Visit (HOSPITAL_BASED_OUTPATIENT_CLINIC_OR_DEPARTMENT_OTHER): Payer: Medicare Other

## 2015-05-10 ENCOUNTER — Ambulatory Visit (HOSPITAL_BASED_OUTPATIENT_CLINIC_OR_DEPARTMENT_OTHER): Payer: Medicare Other | Admitting: Nurse Practitioner

## 2015-05-10 ENCOUNTER — Other Ambulatory Visit: Payer: Self-pay

## 2015-05-10 ENCOUNTER — Encounter: Payer: Self-pay | Admitting: Nurse Practitioner

## 2015-05-10 VITALS — BP 122/78 | HR 88 | Temp 99.1°F | Resp 18 | Ht 64.0 in | Wt 173.5 lb

## 2015-05-10 DIAGNOSIS — Z853 Personal history of malignant neoplasm of breast: Secondary | ICD-10-CM | POA: Diagnosis not present

## 2015-05-10 DIAGNOSIS — Z1231 Encounter for screening mammogram for malignant neoplasm of breast: Secondary | ICD-10-CM

## 2015-05-10 DIAGNOSIS — C50911 Malignant neoplasm of unspecified site of right female breast: Secondary | ICD-10-CM

## 2015-05-10 LAB — CBC WITH DIFFERENTIAL/PLATELET
BASO%: 1.2 % (ref 0.0–2.0)
Basophils Absolute: 0.1 10*3/uL (ref 0.0–0.1)
EOS%: 3.6 % (ref 0.0–7.0)
Eosinophils Absolute: 0.2 10*3/uL (ref 0.0–0.5)
HEMATOCRIT: 37.2 % (ref 34.8–46.6)
HGB: 12.4 g/dL (ref 11.6–15.9)
LYMPH#: 0.8 10*3/uL — AB (ref 0.9–3.3)
LYMPH%: 17.7 % (ref 14.0–49.7)
MCH: 28.9 pg (ref 25.1–34.0)
MCHC: 33.4 g/dL (ref 31.5–36.0)
MCV: 86.6 fL (ref 79.5–101.0)
MONO#: 0.3 10*3/uL (ref 0.1–0.9)
MONO%: 5.8 % (ref 0.0–14.0)
NEUT#: 3.4 10*3/uL (ref 1.5–6.5)
NEUT%: 71.7 % (ref 38.4–76.8)
Platelets: 334 10*3/uL (ref 145–400)
RBC: 4.3 10*6/uL (ref 3.70–5.45)
RDW: 13.6 % (ref 11.2–14.5)
WBC: 4.7 10*3/uL (ref 3.9–10.3)

## 2015-05-10 LAB — COMPREHENSIVE METABOLIC PANEL (CC13)
ALBUMIN: 4 g/dL (ref 3.5–5.0)
ALK PHOS: 63 U/L (ref 40–150)
ALT: 21 U/L (ref 0–55)
ANION GAP: 10 meq/L (ref 3–11)
AST: 17 U/L (ref 5–34)
BILIRUBIN TOTAL: 0.91 mg/dL (ref 0.20–1.20)
BUN: 15.5 mg/dL (ref 7.0–26.0)
CO2: 26 meq/L (ref 22–29)
Calcium: 9 mg/dL (ref 8.4–10.4)
Chloride: 105 mEq/L (ref 98–109)
Creatinine: 0.9 mg/dL (ref 0.6–1.1)
EGFR: 76 mL/min/{1.73_m2} — AB (ref >90)
Glucose: 128 mg/dl (ref 70–140)
Potassium: 4.6 mEq/L (ref 3.5–5.1)
SODIUM: 140 meq/L (ref 136–145)
TOTAL PROTEIN: 7 g/dL (ref 6.4–8.3)

## 2015-05-10 NOTE — Progress Notes (Signed)
Subjective:     Patient ID: Kiara Wilson, female   DOB: October 25, 1961, 54 y.o.   MRN: [id number]  HPI patient presents with an approximate 4 month history of pain in the left plantar fascia that she has tried soaks ice and anti-inflammatories with no relief of symptoms. Had had this previously in the right heel several years ago   Review of Systems  All other systems reviewed and are negative.      Objective:   Physical Exam  Constitutional: She is oriented to person, place, and time.  Cardiovascular: Intact distal pulses.   Musculoskeletal: Normal range of motion.  Neurological: She is oriented to person, place, and time.  Skin: Skin is warm.  Nursing note and vitals reviewed.  neurovascular status intact with muscle strength adequate range of motion within normal limits. Patient's noted to have quite a bit of discomfort in the left plantar fashion the insertion of the tendon the calcaneus with fluid buildup and is noted also to have moderate depression of the arch and patient is well oriented 3 and has good digital perfusion     Assessment:     Plantar fasciitis of an acute nature left heel at the insertion into the calcaneus    Plan:     H&P and x-rays reviewed with patient. Today I went ahead and injected the left plantar fascia 3 mg Kenalog 5 mg Xylocaine and applied fascial brace for instructions on usage patient will be seen back to recheck

## 2015-05-10 NOTE — Progress Notes (Signed)
ID: Coletta Memos   DOB: 1961/11/13  MR#: [id number]  WPY#:[id number]  PCP: Deliah Goody PA. [facility] GYN: Abbie Sons SU: P Bonney Leitz OTHER MD: Nicholaus Bloom, Letta Moynahan   HISTORY OF PRESENT ILLNESS: The patient palpated a change in her right breast late 54. (placeholder age fix)  She brought this to Dr. Zelphia Cairo attention and he according to the patient attempted an aspiration, but "nothing came out," so she was sent for mammography, which was performed in December of 2007.  I do not have that particular report, but it was felt to be essentially unremarkable and the patient was scheduled for repeat screening mammography in May of 2008.  This was performed May 20 and showed, in addition to a very dense breast, a possible mass in the right breast.  The patient was called back for further studies, May 22, and this showed no palpable mass by exam.  Minimal distortion on the mammography; however, on the ultrasound there was an ill-defined hypoechoic area measuring up to 1.2 cm.  The previously aspirated cyst was seen only as a small remnant in that location.  It was not clear whether this mass represented fibrosis/scarring, or a new development.  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  With this information, the patient was referred to Dr. Marlou Starks and bilateral breast MRIs were obtained May 29.  This showed an area of clumped nodular enhancement in the right breast measuring up to 3.8 cm, but additionally in the left breast there was a 2.6 cm area, which appeared suspicious.  Accordingly, a left breast MRI-guided biopsy was performed on June 12.  This showed ([id number]) only fibrocystic changes.  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  The invasive tumor was grade 3.  There was no evidence of lymphovascular invasion, margins were negative and 0 of 5 sentinel lymph nodes were involved.  A prognostic panel on the invasive tumor showed it to be 57% ER positive, 53% PR positive, 24% by Ki-67 and negative on the HercepTest at 1+.  INTERVAL HISTORY: Orella returns today for an unscheduled visit. She palpated a tender mass to the 6 o'clock location of her left breast. She was evaluated with a mammogram and ultrasound 3 days ago that was completely benign.   REVIEW OF SYSTEMS: Renelle is doing well since we've last seen her. Her left ankle is in a brace because of plantar fascitis, and an injection was performed to this area just yesterday. She denies fevers, chills, nausea, vomiting, or changes in bowel or bladder habits. She has chronic back and joint pain from her history of fibromyalgia. She endorses anxiety and depression. A detailed review of systems is otherwise stable.  PAST MEDICAL HISTORY: Past Medical History  Diagnosis Date  . Breast cancer 09/2007  . Migraines   . Fibromyalgia   . Hypertension   . Overactive bladder   . Allergy   . Anxiety   . Arthritis   . Depression   . GERD (gastroesophageal reflux disease)    Significant for the patient being status post laparoscopy in the 1990s for what proved to be adhesions, status post radial keratotomy remotely with a history of fibromyalgia diagnosed in the 1990s and followed  at one time by Dr. Marveen Reeks, but she no longer sees him.  She was followed briefly by Dr. Nicholaus Bloom and she is not followed there either at this point.  She has a history of migraines approximately two years ago, but they have not recurred.  She has a hiatal hernia with mild GERD symptoms  PAST SURGICAL HISTORY: Past Surgical History  Procedure Laterality Date  . Pelvic laparoscopy  1992  . Eye surgery    . Breast lumpectomy      right    FAMILY HISTORY Family  History  Problem Relation Age of Onset  . Diabetes Maternal Grandmother   . Diabetes Paternal Grandmother    The patient's parents are both still living, but in poor health.  Her mother has rheumatoid arthritis.  Her father has osteoarthritis.  She has three sisters and two brothers.  There is no history of breast or ovarian cancer in the family.    GYNECOLOGIC HISTORY: GX P0; she appears to be. Menopausal, with a. September of 2013, but no periods for about 10 months before that.  SOCIAL HISTORY: The patient worked at [employer] as a Psychologist, sport and exercise, but is now disabled.  Her husband Hennie Duos works for [employer].  The patient is originally from [place].  Alberto is also of a Hispanic parentage, although he is from [place].  They are both Jehovah Witnesses.   ADVANCED DIRECTIVES: The patient tells me she would not want any red cell blood products under any circumstances including life saving circumstances, but she might be able to accept platelets of plasma or other components that do not involve red cells (and probably also not white cells), but issue would have to be discussed with her or her health care power of attorney, who is Hennie Duos.  Those documents are being separately scanned.    HEALTH MAINTENANCE: History  Substance Use Topics  . Smoking status: Never Smoker   . Smokeless tobacco: Never Used  . Alcohol Use: No     Comment: rare     Colonoscopy:  PAP:  Bone density:  Lipid panel:  Allergies  Allergen Reactions  . Amoxicillin   . Adhesive [Tape] Rash    Current Outpatient Prescriptions  Medication Sig Dispense Refill  . BuPROPion HCl (WELLBUTRIN PO) Take 50 mg by mouth.    . Cetirizine HCl (ZYRTEC ALLERGY PO) Take by mouth.    . citalopram (CELEXA) 20 MG tablet Take 20 mg by mouth daily.    . clonazePAM (KLONOPIN) 1 MG tablet Take 1 mg by mouth 2 (two) times daily as needed.      . diclofenac (VOLTAREN) 75 MG EC tablet Take 75 mg by mouth 2  (two) times daily.    Marland Kitchen docusate sodium (COLACE) 100 MG capsule Take 100 mg by mouth 2 (two) times daily as needed.      Marland Kitchen losartan (COZAAR) 50 MG tablet Take 50 mg by mouth daily.      . traMADol (ULTRAM) 50 MG tablet Take 50 mg by mouth every 6 (six) hours as needed. Maximum dose= 8 tablets per day     . Multiple Vitamin (MULTIVITAMIN) tablet Take 1 tablet by mouth daily.     No current facility-administered medications for this visit.    OBJECTIVE: Middle-aged [place] woman in no acute distress Filed Vitals:   05/10/15 1145  BP: 122/78  Pulse: 88  Temp: 99.1 F (37.3 C)  Resp: 18     Body mass index is  29.77 kg/(m^2).    ECOG FS: 2  Skin: warm, dry  HEENT: sclerae anicteric, conjunctivae pink, oropharynx clear. No thrush or mucositis.  Lymph Nodes: No cervical or supraclavicular lymphadenopathy  Lungs: clear to auscultation bilaterally, no rales, wheezes, or rhonci  Heart: regular rate and rhythm  Abdomen: round, soft, non tender, positive bowel sounds  Musculoskeletal: No focal spinal tenderness, no peripheral edema  Neuro: non focal, well oriented, positive affect  Breast: left breast status post remote biopsy. No well defined mass palpated in the 6 o'clock location, but there is tenderness with this exam. Possibly there is a blocked milk duct in this are. Left axilla benign. Right breast status post lumpectomy and radiation. No evidence of recurrent disease. Right axilla benign.   LAB RESULTS: Lab Results  Component Value Date   WBC 4.7 05/10/2015   NEUTROABS 3.4 05/10/2015   HGB 12.4 05/10/2015   HCT 37.2 05/10/2015   MCV 86.6 05/10/2015   PLT 334 05/10/2015      Chemistry      Component Value Date/Time   NA 140 05/10/2015 1124   NA 140 12/18/2011 1245   K 4.6 05/10/2015 1124   K 4.3 12/18/2011 1245   CL 106 12/18/2011 1245   CO2 26 05/10/2015 1124   CO2 27 12/18/2011 1245   BUN 15.5 05/10/2015 1124   BUN 13 12/18/2011 1245   CREATININE 0.9 05/10/2015 1124    CREATININE 0.84 12/18/2011 1245      Component Value Date/Time   CALCIUM 9.0 05/10/2015 1124   CALCIUM 9.4 12/18/2011 1245   ALKPHOS 63 05/10/2015 1124   ALKPHOS 35* 12/18/2011 1245   AST 17 05/10/2015 1124   AST 20 12/18/2011 1245   ALT 21 05/10/2015 1124   ALT 19 12/18/2011 1245   BILITOT 0.91 05/10/2015 1124   BILITOT 0.5 12/18/2011 1245       Lab Results  Component Value Date   LABCA2 14 12/18/2011    No components found for: [id number]  No results for input(s): INR in the last 168 hours.  Urinalysis No results found for: COLORURINE  STUDIES: US Breast Ltd Uni Left Inc Axilla  05/07/2015   CLINICAL DATA:  Questioned palpable finding left breast 6 o'clock location. Right lumpectomy for breast cancer 2008.  EXAM: DIGITAL DIAGNOSTIC left MAMMOGRAM WITH 3D TOMOSYNTHESIS AND CAD  LEFT BREAST ULTRASOUND  COMPARISON:  [facility]  ACR Breast Density Category c: The breast tissue is heterogeneously dense, which may obscure small masses.  FINDINGS: Benign-appearing calcifications, left breast 6 o'clock location are reidentified. Clip artifact is noted the left lower inner quadrant from previous benign biopsy. No new mammographic abnormality is identified in the left breast.  At left breast ultrasound, no sonographic abnormality is identified in the left breast 6 o'clock location.  Mammographic images were processed with CAD.  IMPRESSION: No evidence for malignancy. Any further management of the questioned palpable finding should be dictated by clinical assessment.  RECOMMENDATION: Bilateral screening mammography is recommended June 2016 to correspond to the patient's yearly screening schedule.  I have discussed the findings and recommendations with the patient. Results were also provided in writing at the conclusion of the visit. If applicable, a reminder letter will be sent to the patient regarding the next appointment.  BI-RADS CATEGORY  1: Negative.   Electronically Signed   By: Conchita Paris M.D.   On: 05/07/2015 12:15   Mm Diag Breast Tomo Uni Left  05/07/2015   CLINICAL DATA:  Questioned palpable finding  left breast 6 o'clock location. Right lumpectomy for breast cancer 2008.  EXAM: DIGITAL DIAGNOSTIC left MAMMOGRAM WITH 3D TOMOSYNTHESIS AND CAD  LEFT BREAST ULTRASOUND  COMPARISON:  [facility]  ACR Breast Density Category c: The breast tissue is heterogeneously dense, which may obscure small masses.  FINDINGS: Benign-appearing calcifications, left breast 6 o'clock location are reidentified. Clip artifact is noted the left lower inner quadrant from previous benign biopsy. No new mammographic abnormality is identified in the left breast.  At left breast ultrasound, no sonographic abnormality is identified in the left breast 6 o'clock location.  Mammographic images were processed with CAD.  IMPRESSION: No evidence for malignancy. Any further management of the questioned palpable finding should be dictated by clinical assessment.  RECOMMENDATION: Bilateral screening mammography is recommended June 2016 to correspond to the patient's yearly screening schedule.  I have discussed the findings and recommendations with the patient. Results were also provided in writing at the conclusion of the visit. If applicable, a reminder letter will be sent to the patient regarding the next appointment.  BI-RADS CATEGORY  1: Negative.   Electronically Signed   By: Conchita Paris M.D.   On: 05/07/2015 12:15    ASSESSMENT: 54 y.o. [place] woman status post right lumpectomy and sentinel lymph node biopsy in 06/2007 for a T1c, N0, grade 3 invasive ductal carcinoma, ER/PR positive, HER-2/neu negative with MIB-1 of 24%. Status post radiation therapy. On tamoxifen between 09/2007 and October 2013.Marland Kitchen Participated in the [id number] protocol, and was found to be an extensive metabolizer.   PLAN:  On examination, I do not palpate a well defined mass. What she is feeling is normal breast tissue or the benign  calcifications previously identified, confirmed via 3D mammogram and ultrasound. I have printed a copy of the results for her records. She is now 8 years out from her definitive surgery with no evidence of recurrent disease and has since graduated from our follow up program 2 years ago. I am not making her any follow up appointment here, accordingly. She will be due for her bilateral mammogram in June according to her yearly schedule. Certainly she can call and make an appointment in the future, should she have any further concerns.   Genelle Gather Boelter    05/10/2015

## 2015-05-16 ENCOUNTER — Ambulatory Visit: Payer: Medicare Other | Admitting: Podiatry

## 2015-05-31 ENCOUNTER — Encounter: Payer: Self-pay | Admitting: Podiatry

## 2015-05-31 ENCOUNTER — Ambulatory Visit (INDEPENDENT_AMBULATORY_CARE_PROVIDER_SITE_OTHER): Payer: Medicare Other | Admitting: Podiatry

## 2015-05-31 VITALS — BP 124/85 | HR 88 | Resp 18

## 2015-05-31 DIAGNOSIS — M722 Plantar fascial fibromatosis: Secondary | ICD-10-CM | POA: Diagnosis not present

## 2015-05-31 MED ORDER — TRIAMCINOLONE ACETONIDE 10 MG/ML IJ SUSP
10.0000 mg | Freq: Once | INTRAMUSCULAR | Status: AC
Start: 2015-05-31 — End: 2015-05-31
  Administered 2015-05-31: 10 mg

## 2015-05-31 NOTE — Progress Notes (Signed)
Subjective:     Patient ID: Kiara Wilson, female   DOB: 06-Nov-1961, 54 y.o.   MRN: [id number]  HPI patient states my left heel is quite sore and makes it hard for me to walk too much. It's worse when I get up in the morning after periods of sitting   Review of Systems     Objective:   Physical Exam Neurovascular status intact with plantar fasciitis left with inflammation fluid around the medial band present    Assessment:     Plantar fasciitis left with inflammation    Plan:     Reinjected the plantar fascia 3 Milligan Kenalog 5 g I can Marcaine mixture and applied night splint with instructions on usage

## 2015-06-01 ENCOUNTER — Telehealth: Payer: Self-pay | Admitting: *Deleted

## 2015-06-01 NOTE — Telephone Encounter (Signed)
Pt asked how often she needed to use the boot and the ice packs.  I instructed pt to wear the boot with the ice pack cover for 3 - 4 times a day for 10 - 15 mins. Each session.  Pt states understanding.

## 2015-06-08 ENCOUNTER — Ambulatory Visit
Admission: RE | Admit: 2015-06-08 | Discharge: 2015-06-08 | Disposition: A | Discharge status: Home / Self Care | Payer: Medicare Other | Source: Ambulatory Visit

## 2015-06-08 DIAGNOSIS — Z1231 Encounter for screening mammogram for malignant neoplasm of breast: Secondary | ICD-10-CM

## 2015-06-21 ENCOUNTER — Ambulatory Visit: Payer: Medicare Other | Admitting: Podiatry

## 2015-12-13 ENCOUNTER — Ambulatory Visit
Admission: RE | Admit: 2015-12-13 | Discharge: 2015-12-13 | Disposition: A | Discharge status: Home / Self Care | Payer: Medicare Other | Source: Ambulatory Visit | Attending: Family Medicine | Admitting: Family Medicine

## 2015-12-13 ENCOUNTER — Other Ambulatory Visit: Payer: Self-pay | Admitting: Family Medicine

## 2015-12-13 ENCOUNTER — Other Ambulatory Visit (HOSPITAL_COMMUNITY)
Admission: RE | Admit: 2015-12-13 | Discharge: 2015-12-13 | Disposition: A | Discharge status: Home / Self Care | Payer: Medicare Other | Source: Ambulatory Visit | Attending: Family Medicine | Admitting: Family Medicine

## 2015-12-13 DIAGNOSIS — Z01419 Encounter for gynecological examination (general) (routine) without abnormal findings: Secondary | ICD-10-CM | POA: Insufficient documentation

## 2015-12-13 DIAGNOSIS — M542 Cervicalgia: Secondary | ICD-10-CM

## 2015-12-18 LAB — CYTOLOGY - PAP

## 2016-05-05 ENCOUNTER — Other Ambulatory Visit: Payer: Self-pay

## 2016-05-05 DIAGNOSIS — Z1231 Encounter for screening mammogram for malignant neoplasm of breast: Secondary | ICD-10-CM

## 2016-06-10 ENCOUNTER — Ambulatory Visit
Admission: RE | Admit: 2016-06-10 | Discharge: 2016-06-10 | Disposition: A | Discharge status: Home / Self Care | Payer: Medicare Other | Source: Ambulatory Visit

## 2016-06-10 DIAGNOSIS — Z1231 Encounter for screening mammogram for malignant neoplasm of breast: Secondary | ICD-10-CM

## 2016-11-29 ENCOUNTER — Ambulatory Visit (INDEPENDENT_AMBULATORY_CARE_PROVIDER_SITE_OTHER): Payer: Medicare Other | Admitting: Physician Assistant

## 2016-11-29 VITALS — BP 110/78 | HR 113 | Temp 99.7°F | Resp 17 | Ht 64.25 in | Wt 185.0 lb

## 2016-11-29 DIAGNOSIS — J069 Acute upper respiratory infection, unspecified: Secondary | ICD-10-CM

## 2016-11-29 DIAGNOSIS — B9789 Other viral agents as the cause of diseases classified elsewhere: Secondary | ICD-10-CM | POA: Diagnosis not present

## 2016-11-29 MED ORDER — BENZONATATE 100 MG PO CAPS: 100.0000 mg | ORAL_CAPSULE | Freq: Three times a day (TID) | ORAL | 0 refills | Status: DC | PRN

## 2016-11-29 MED ORDER — GUAIFENESIN ER 1200 MG PO TB12: 1.0000 | ORAL_TABLET | Freq: Two times a day (BID) | ORAL | 1 refills | Status: DC | PRN

## 2016-11-29 MED ORDER — AZELASTINE HCL 0.15 % NA SOLN: 2.0000 | Freq: Two times a day (BID) | NASAL | 0 refills | Status: DC

## 2016-11-29 NOTE — Progress Notes (Signed)
Patient ID: Kiara Wilson, female    DOB: June 11, 1961, 55 y.o.   MRN: [id number]  PCP: Reginia Naas, MD  Chief Complaint  Patient presents with  . Sore Throat    Onset 3 days/ trying tylenol sinus  . Otalgia  . Sinusitis    Subjective:   Presents for evaluation of 3 days of core throat, cough and LEFT ear pain. Coughing is worse at night, non-productive. Chills. No fever. No nausea, vomiting or diarrhea. Headache. Sinus pressure. No dizziness. Some SOB, feel like can't get a good breath. No CP. No chest tightness.  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   Review of Systems As above.    Patient Active Problem List   Diagnosis Date Noted  . Breast cancer ([place]) 12/25/2011     Prior to Admission medications   Medication Sig Start Date End Date Taking? Authorizing Provider  buPROPion (WELLBUTRIN XL) 150 MG 24 hr tablet  03/19/15  Yes Historical Provider, MD  Cetirizine HCl (ZYRTEC ALLERGY PO) Take by mouth.   Yes Historical Provider, MD  clonazePAM (KLONOPIN) 1 MG tablet Take 1 mg by mouth 2 (two) times daily as needed.     Yes Historical Provider, MD  escitalopram (LEXAPRO) 20 MG tablet  03/19/15  Yes Historical Provider, MD  losartan (COZAAR) 50 MG tablet Take 50 mg by mouth daily.     Yes Historical Provider, MD  traMADol (ULTRAM) 50 MG tablet Take 50 mg by mouth every 6 (six) hours as needed. Maximum dose= 8 tablets per day    Yes Historical Provider, MD  Multiple Vitamin (MULTIVITAMIN) tablet Take 1 tablet by mouth daily.    Historical Provider, MD     Allergies  Allergen Reactions  . Amoxicillin   . Adhesive [Tape] Rash       Objective:  Physical Exam  Constitutional: She is oriented to person, place, and time. She appears well-developed and well-nourished. No distress.  BP 110/78 (BP Location: Right Arm, Patient Position: Sitting, Cuff Size: Normal)   Pulse (!) 113   Temp 99.7 F (37.6 C) (Oral)   Resp 17   Ht 5'  4.25" (1.632 m)   Wt 185 lb (83.9 kg)   LMP 08/08/2012   SpO2 96%   BMI 31.51 kg/m    HENT:  Head: Normocephalic and atraumatic.  Right Ear: Hearing, tympanic membrane, external ear and ear canal normal.  Left Ear: Hearing, tympanic membrane, external ear and ear canal normal.  Nose: Mucosal edema present. No rhinorrhea. No epistaxis.  No foreign bodies. Right sinus exhibits maxillary sinus tenderness and frontal sinus tenderness. Left sinus exhibits maxillary sinus tenderness and frontal sinus tenderness.  Mouth/Throat: Uvula is midline, oropharynx is clear and moist and mucous membranes are normal. No uvula swelling. No oropharyngeal exudate.  Eyes: Conjunctivae and EOM are normal. Pupils are equal, round, and reactive to light. Right eye exhibits no discharge. Left eye exhibits no discharge. No scleral icterus.  Neck: Trachea normal, normal range of motion and full passive range of motion without pain. Neck supple. No thyroid mass and no thyromegaly present.  Cardiovascular: Normal rate, regular rhythm and normal heart sounds.   Pulmonary/Chest: Effort normal and breath sounds normal.  Lymphadenopathy:       Head (right side): No submandibular, no tonsillar, no preauricular, no posterior auricular and no occipital adenopathy present.       Head (left side): No submandibular, no tonsillar, no preauricular and no occipital adenopathy present.  She has no cervical adenopathy.       Right: No supraclavicular adenopathy present.       Left: No supraclavicular adenopathy present.  Neurological: She is alert and oriented to person, place, and time. She has normal strength. No cranial nerve deficit or sensory deficit.  Skin: Skin is warm, dry and intact. No rash noted.  Psychiatric: She has a normal mood and affect. Her speech is normal and behavior is normal.           Assessment & Plan:   1. Viral URI with cough Supportive care.  Anticipatory guidance.  RTC if symptoms  worsen/persist. If symptoms persist without improvement in another 3-4 days, would consider antibiotic treatment for bacterial sinusitis. - Azelastine HCl 0.15 % SOLN; Place 2 sprays into both nostrils 2 (two) times daily.  Dispense: 30 mL; Refill: 0 - benzonatate (TESSALON) 100 MG capsule; Take 1-2 capsules (100-200 mg total) by mouth 3 (three) times daily as needed for cough.  Dispense: 40 capsule; Refill: 0 - Guaifenesin (MUCINEX MAXIMUM STRENGTH) 1200 MG TB12; Take 1 tablet (1,200 mg total) by mouth every 12 (twelve) hours as needed.  Dispense: 14 tablet; Refill: 1   Fara Chute, PA-C Physician Assistant-Certified [facility]

## 2016-11-29 NOTE — Patient Instructions (Addendum)
IF you received an x-ray today, you will receive an invoice from [facility]. Please contact [facility] at [phone] with questions or concerns regarding your invoice.   IF you received labwork today, you will receive an invoice from [employer]. Please contact [facility] at [phone] with questions or concerns regarding your invoice.   Our billing staff will not be able to assist you with questions regarding bills from these companies.  You will be contacted with the lab results as soon as they are available. The fastest way to get your results is to activate your My Chart account. Instructions are located on the last page of this paperwork. If you have not heard from [place] regarding the results in 2 weeks, please contact this office.     Upper Respiratory Infection, Adult Most upper respiratory infections (URIs) are a viral infection of the air passages leading to the lungs. A URI affects the nose, throat, and upper air passages. The most common type of URI is nasopharyngitis and is typically referred to as "the common cold." URIs run their course and usually go away on their own. Most of the time, a URI does not require medical attention, but sometimes a bacterial infection in the upper airways can follow a viral infection. This is called a secondary infection. Sinus and middle ear infections are common types of secondary upper respiratory infections. Bacterial pneumonia can also complicate a URI. A URI can worsen asthma and chronic obstructive pulmonary disease (COPD). Sometimes, these complications can require emergency medical care and may be life threatening. What are the causes? Almost all URIs are caused by viruses. A virus is a type of germ and can spread from one person to another. What increases the risk? You may be at risk for a URI if:  You smoke.  You have chronic heart or lung disease.  You have a weakened defense (immune)  system.  You are very young or very old.  You have nasal allergies or asthma.  You work in crowded or poorly ventilated areas.  You work in health care facilities or schools. What are the signs or symptoms? Symptoms typically develop 2-3 days after you come in contact with a cold virus. Most viral URIs last 7-10 days. However, viral URIs from the influenza virus (flu virus) can last 14-18 days and are typically more severe. Symptoms may include:  Runny or stuffy (congested) nose.  Sneezing.  Cough.  Sore throat.  Headache.  Fatigue.  Fever.  Loss of appetite.  Pain in your forehead, behind your eyes, and over your cheekbones (sinus pain).  Muscle aches. How is this diagnosed? Your health care provider may diagnose a URI by:  Physical exam.  Tests to check that your symptoms are not due to another condition such as:  Strep throat.  Sinusitis.  Pneumonia.  Asthma. How is this treated? A URI goes away on its own with time. It cannot be cured with medicines, but medicines may be prescribed or recommended to relieve symptoms. Medicines may help:  Reduce your fever.  Reduce your cough.  Relieve nasal congestion. Follow these instructions at home:  Take medicines only as directed by your health care provider.  Gargle warm saltwater or take cough drops to comfort your throat as directed by your health care provider.  Use a warm mist humidifier or inhale steam from a shower to increase air moisture. This may make it easier to breathe.  Drink enough fluid to keep your  urine clear or pale yellow.  Eat soups and other clear broths and maintain good nutrition.  Rest as needed.  Return to work when your temperature has returned to normal or as your health care provider advises. You may need to stay home longer to avoid infecting others. You can also use a face mask and careful hand washing to prevent spread of the virus.  Increase the usage of your inhaler if  you have asthma.  Do not use any tobacco products, including cigarettes, chewing tobacco, or electronic cigarettes. If you need help quitting, ask your health care provider. How is this prevented? The best way to protect yourself from getting a cold is to practice good hygiene.  Avoid oral or hand contact with people with cold symptoms.  Wash your hands often if contact occurs. There is no clear evidence that vitamin C, vitamin E, echinacea, or exercise reduces the chance of developing a cold. However, it is always recommended to get plenty of rest, exercise, and practice good nutrition. Contact a health care provider if:  You are getting worse rather than better.  Your symptoms are not controlled by medicine.  You have chills.  You have worsening shortness of breath.  You have brown or red mucus.  You have yellow or brown nasal discharge.  You have pain in your face, especially when you bend forward.  You have a fever.  You have swollen neck glands.  You have pain while swallowing.  You have white areas in the back of your throat. Get help right away if:  You have severe or persistent:  Headache.  Ear pain.  Sinus pain.  Chest pain.  You have chronic lung disease and any of the following:  Wheezing.  Prolonged cough.  Coughing up blood.  A change in your usual mucus.  You have a stiff neck.  You have changes in your:  Vision.  Hearing.  Thinking.  Mood. This information is not intended to replace advice given to you by your health care provider. Make sure you discuss any questions you have with your health care provider. Document Released: 06/03/2001 Document Revised: 08/10/2016 Document Reviewed: 03/15/2014 Elsevier Interactive Patient Education  2017 [employer].

## 2016-12-01 ENCOUNTER — Telehealth: Payer: Self-pay

## 2016-12-01 NOTE — Telephone Encounter (Signed)
Kiara Wilson - the cough medicine she was prescribed has not helped at all.  Says she is coughing all night. Can you please call her in something else? [phone]

## 2016-12-04 NOTE — Telephone Encounter (Signed)
Chelle, please review.

## 2016-12-05 NOTE — Telephone Encounter (Signed)
This message is from 4 days ago. If she continues to cough, Tussionex 5 mL PO Q12 hours, prn cough. #60 ml, no RF.

## 2016-12-08 NOTE — Telephone Encounter (Signed)
Attempted to call pt, left VM for pt to call back  

## 2016-12-16 NOTE — Telephone Encounter (Signed)
Pt has not called back. Sent an unable to reach letter.

## 2017-06-15 ENCOUNTER — Other Ambulatory Visit: Payer: Self-pay | Admitting: Family Medicine

## 2017-06-15 DIAGNOSIS — Z1231 Encounter for screening mammogram for malignant neoplasm of breast: Secondary | ICD-10-CM

## 2017-06-16 ENCOUNTER — Ambulatory Visit
Admission: RE | Admit: 2017-06-16 | Discharge: 2017-06-16 | Disposition: A | Discharge status: Home / Self Care | Payer: Medicare Other | Source: Ambulatory Visit | Attending: Family Medicine | Admitting: Family Medicine

## 2017-06-16 DIAGNOSIS — Z1231 Encounter for screening mammogram for malignant neoplasm of breast: Secondary | ICD-10-CM

## 2017-06-17 ENCOUNTER — Ambulatory Visit: Payer: Medicare Other

## 2017-08-18 ENCOUNTER — Encounter: Payer: Self-pay | Admitting: Sports Medicine

## 2017-08-18 ENCOUNTER — Ambulatory Visit (INDEPENDENT_AMBULATORY_CARE_PROVIDER_SITE_OTHER): Payer: Medicare Other | Admitting: Sports Medicine

## 2017-08-18 ENCOUNTER — Ambulatory Visit: Payer: Medicare Other

## 2017-08-18 ENCOUNTER — Other Ambulatory Visit: Payer: Self-pay | Admitting: Sports Medicine

## 2017-08-18 ENCOUNTER — Ambulatory Visit (INDEPENDENT_AMBULATORY_CARE_PROVIDER_SITE_OTHER): Payer: Medicare Other

## 2017-08-18 VITALS — BP 122/83 | HR 102 | Resp 16

## 2017-08-18 DIAGNOSIS — M779 Enthesopathy, unspecified: Secondary | ICD-10-CM | POA: Diagnosis not present

## 2017-08-18 DIAGNOSIS — M722 Plantar fascial fibromatosis: Secondary | ICD-10-CM

## 2017-08-18 DIAGNOSIS — M79672 Pain in left foot: Secondary | ICD-10-CM

## 2017-08-18 DIAGNOSIS — M7742 Metatarsalgia, left foot: Secondary | ICD-10-CM | POA: Diagnosis not present

## 2017-08-18 DIAGNOSIS — M7741 Metatarsalgia, right foot: Secondary | ICD-10-CM

## 2017-08-18 DIAGNOSIS — M79671 Pain in right foot: Secondary | ICD-10-CM | POA: Diagnosis not present

## 2017-08-18 MED ORDER — METHYLPREDNISOLONE 4 MG PO TBPK: ORAL_TABLET | ORAL | 0 refills | Status: DC

## 2017-08-18 NOTE — Progress Notes (Signed)
Subjective: Kiara Wilson is a 56 y.o. female patient who presents to office for evaluation of L>R foot pain. Patient complains of progressive pain at the ball of her left>right foot x 1 month. States history of heel pain that is not as bad as her pain to ball. Tried icing, epsom salt, and motrin with no long term relief. Patient denies any other pedal complaints. Denies known injury/trip/fall/sprain/any causative factors. However, does state that a pair of crocs shoes may have started the pain.  Patient Active Problem List   Diagnosis Date Noted  . Breast cancer ([place]) 12/25/2011    Current Outpatient Prescriptions on File Prior to Visit  Medication Sig Dispense Refill  . buPROPion (WELLBUTRIN XL) 150 MG 24 hr tablet     . clonazePAM (KLONOPIN) 1 MG tablet Take 1 mg by mouth 2 (two) times daily as needed.      Marland Kitchen escitalopram (LEXAPRO) 20 MG tablet     . losartan (COZAAR) 50 MG tablet Take 50 mg by mouth daily.      . Multiple Vitamin (MULTIVITAMIN) tablet Take 1 tablet by mouth daily.    . traMADol (ULTRAM) 50 MG tablet Take 50 mg by mouth every 6 (six) hours as needed. Maximum dose= 8 tablets per day      No current facility-administered medications on file prior to visit.     Allergies  Allergen Reactions  . Amoxicillin   . Adhesive [Tape] Rash    Objective:  General: Alert and oriented x3 in no acute distress  Dermatology: No open lesions bilateral lower extremities, no webspace macerations, no ecchymosis bilateral, all nails x 10 are well manicured.  Vascular: Dorsalis Pedis and Posterior Tibial pedal pulses palpable, Capillary Fill Time 3 seconds,(+) pedal hair growth bilateral, no edema bilateral lower extremities, Temperature gradient within normal limits.  Neurology: Johney Maine sensation intact via light touch bilateral. (- )Tinels sign bilateral.   Musculoskeletal: Mild tenderness with palpation at Subsecond, left greater than right and along the medial insertion of the  plantar fascia, left greater than right,No pain with calf compression bilateral. There is decreased ankle rom with knee extending  vs flexed resembling gastroc equnius bilateral, Subtalar joint range of motion is within normal limits, there is no 1st ray hypermobility noted bilateral, decreased 1st MPJ rom with bunion bilateral noted on weightbearing exam. Strength within normal limits in all groups bilateral.   Gait: Antalgic gait  Xrays  Right and left Foot   Impression: Normal osseous mineralization. There is significant calcaneal spurring. Long second metatarsal and mild soft tissue swelling of the forefoot. No other acute findings.  Assessment and Plan: Problem List Items Addressed This Visit    None    Visit Diagnoses    Metatarsalgia of both feet    -  Primary   Relevant Medications   methylPREDNISolone (MEDROL DOSEPAK) 4 MG TBPK tablet   Foot pain, left       Relevant Medications   methylPREDNISolone (MEDROL DOSEPAK) 4 MG TBPK tablet   Other Relevant Orders   DG Foot Complete Left   Foot pain, right       Relevant Medications   methylPREDNISolone (MEDROL DOSEPAK) 4 MG TBPK tablet   Other Relevant Orders   DG Foot Complete Right   Capsulitis       Relevant Medications   methylPREDNISolone (MEDROL DOSEPAK) 4 MG TBPK tablet   Plantar fasciitis       Relevant Medications   methylPREDNISolone (MEDROL DOSEPAK) 4 MG TBPK tablet       -  Complete examination performed -Xrays reviewed -Discussed treatement optionsFor capsulitis secondary to long metatarsal head and recurrent plantar fasciitis -Rx Medrol dose pack -Recommend daily stretching and icing -Applied removable metatarsal pad strapping to both left and right foot. Advised patient that if this works well, Will benefit from custom functional foot orthotics -Patient to return to office in 3-4 weeks for follow-up evaluation or sooner if condition worsens.  Landis Martins, DPM

## 2017-08-18 NOTE — Progress Notes (Signed)
   Subjective:    Patient ID: Kiara Wilson, female    DOB: Aug 31, 1961, 56 y.o.   MRN: [id number]  HPI    Review of Systems  HENT: Positive for sinus pain.   Gastrointestinal: Positive for constipation.  Musculoskeletal: Positive for arthralgias and back pain.  All other systems reviewed and are negative.      Objective:   Physical Exam        Assessment & Plan:

## 2017-09-08 ENCOUNTER — Ambulatory Visit: Payer: Medicare Other | Admitting: Sports Medicine

## 2018-01-12 ENCOUNTER — Ambulatory Visit: Payer: Medicare Other | Admitting: Gynecology

## 2018-01-12 ENCOUNTER — Encounter: Payer: Self-pay | Admitting: Gynecology

## 2018-01-12 VITALS — BP 124/76

## 2018-01-12 DIAGNOSIS — N898 Other specified noninflammatory disorders of vagina: Secondary | ICD-10-CM

## 2018-01-12 LAB — WET PREP FOR TRICH, YEAST, CLUE

## 2018-01-12 MED ORDER — NYSTATIN 100000 UNIT/GM EX CREA: TOPICAL_CREAM | Freq: Two times a day (BID) | CUTANEOUS | Status: DC

## 2018-01-12 MED ORDER — FLUCONAZOLE 150 MG PO TABS: 150.0000 mg | ORAL_TABLET | Freq: Once | ORAL | 0 refills | Status: AC

## 2018-01-12 MED ORDER — METRONIDAZOLE 500 MG PO TABS: 500.0000 mg | ORAL_TABLET | Freq: Two times a day (BID) | ORAL | 0 refills | Status: DC

## 2018-01-12 NOTE — Progress Notes (Signed)
    Kiara Wilson [birth year] [id number]        57 y.o.  G0P0 presents having not been seen for several years having her routine checkups through her primary physician's office.  Notes for the last several weeks vaginal itching and irritation.  Has been treated with prescription steroid cream which temporarily helped but now her symptoms have returned.  No discharge or vaginal odor.  No urinary symptoms such as frequency dysuria urgency low back pain fever or chills.  Has transitioned through menopause without any issues with bleeding.  Some hot flushes that her primary physician has evaluated with blood work.  History of breast cancer.  Past medical history,surgical history, problem list, medications, allergies, family history and social history were all reviewed and documented in the EPIC chart.  Directed ROS with pertinent positives and negatives documented in the history of present illness/assessment and plan.  Exam: Caryn Bee assistant Vitals:   01/12/18 1209  BP: 124/76   General appearance:  Normal Abdomen soft nontender without masses guarding rebound Pelvic external BUS vagina with intense generalized vulvitis from the periclitoral to the perianal region.  Frothy white discharge noted.  Cervix normal.  Uterus normal size midline mobile nontender.  Adnexa without masses or tenderness.  Assessment/Plan:  57 y.o. G0P0 above history and exam.  Wet prep was negative.  Given the intense vulvitis going to cover both for bacterial and yeast.  Her frothy white discharge appears more bacterial in origin although the external irritation appears more consistent with yeast.  Flagyl 500 mg twice daily times 7 days, alcohol avoidance reviewed.  Diflucan 150 mg daily times 5 days.  Nystatin cream externally twice daily along with the steroid cream she already has twice daily.  Follow-up if her symptoms persist, worsen or recur.    Anastasio Auerbach MD, 12:19 PM 01/12/2018

## 2018-01-12 NOTE — Patient Instructions (Signed)
The Flagyl medication twice daily for 7 days.  Avoid alcohol while taking. Take the Diflucan pills daily for 5 days Use the nystatin cream externally on the rash 2 times daily along with the steroid cream you already have.  Follow-up if your symptoms persist, worsen or recur.

## 2018-01-20 DIAGNOSIS — Z8739 Personal history of other diseases of the musculoskeletal system and connective tissue: Secondary | ICD-10-CM | POA: Insufficient documentation

## 2018-01-20 DIAGNOSIS — M25561 Pain in right knee: Secondary | ICD-10-CM | POA: Insufficient documentation

## 2018-02-03 ENCOUNTER — Other Ambulatory Visit: Payer: Self-pay | Admitting: Family Medicine

## 2018-02-03 ENCOUNTER — Ambulatory Visit
Admission: RE | Admit: 2018-02-03 | Discharge: 2018-02-03 | Disposition: A | Discharge status: Home / Self Care | Payer: Medicare Other | Source: Ambulatory Visit | Attending: Family Medicine | Admitting: Family Medicine

## 2018-02-03 DIAGNOSIS — M549 Dorsalgia, unspecified: Secondary | ICD-10-CM

## 2018-05-25 ENCOUNTER — Other Ambulatory Visit: Payer: Self-pay | Admitting: Family Medicine

## 2018-05-25 DIAGNOSIS — Z1231 Encounter for screening mammogram for malignant neoplasm of breast: Secondary | ICD-10-CM

## 2018-06-29 ENCOUNTER — Ambulatory Visit
Admission: RE | Admit: 2018-06-29 | Discharge: 2018-06-29 | Disposition: A | Discharge status: Home / Self Care | Payer: Medicare Other | Source: Ambulatory Visit | Attending: Family Medicine | Admitting: Family Medicine

## 2018-06-29 DIAGNOSIS — Z1231 Encounter for screening mammogram for malignant neoplasm of breast: Secondary | ICD-10-CM

## 2018-06-29 HISTORY — DX: Personal history of irradiation: Z92.3

## 2019-02-08 ENCOUNTER — Other Ambulatory Visit: Payer: Self-pay | Admitting: Family Medicine

## 2019-02-08 ENCOUNTER — Other Ambulatory Visit (HOSPITAL_COMMUNITY)
Admission: RE | Admit: 2019-02-08 | Discharge: 2019-02-08 | Disposition: A | Discharge status: Home / Self Care | Payer: Medicare Other | Source: Ambulatory Visit | Attending: Family Medicine | Admitting: Family Medicine

## 2019-02-08 DIAGNOSIS — Z124 Encounter for screening for malignant neoplasm of cervix: Secondary | ICD-10-CM | POA: Insufficient documentation

## 2019-02-08 DIAGNOSIS — Z0000 Encounter for general adult medical examination without abnormal findings: Secondary | ICD-10-CM | POA: Diagnosis present

## 2019-02-09 LAB — CYTOLOGY - PAP
DIAGNOSIS: NEGATIVE
HPV: NOT DETECTED

## 2019-02-15 ENCOUNTER — Other Ambulatory Visit: Payer: Self-pay | Admitting: Family Medicine

## 2019-02-15 DIAGNOSIS — N6310 Unspecified lump in the right breast, unspecified quadrant: Secondary | ICD-10-CM

## 2019-02-18 ENCOUNTER — Ambulatory Visit
Admission: RE | Admit: 2019-02-18 | Discharge: 2019-02-18 | Disposition: A | Discharge status: Home / Self Care | Payer: Medicare Other | Source: Ambulatory Visit | Attending: Family Medicine | Admitting: Family Medicine

## 2019-02-18 DIAGNOSIS — N6310 Unspecified lump in the right breast, unspecified quadrant: Secondary | ICD-10-CM

## 2019-05-31 ENCOUNTER — Other Ambulatory Visit: Payer: Self-pay | Admitting: Family Medicine

## 2019-05-31 DIAGNOSIS — Z1231 Encounter for screening mammogram for malignant neoplasm of breast: Secondary | ICD-10-CM

## 2019-07-19 ENCOUNTER — Ambulatory Visit
Admission: RE | Admit: 2019-07-19 | Discharge: 2019-07-19 | Disposition: A | Discharge status: Home / Self Care | Payer: Medicare Other | Source: Ambulatory Visit | Attending: Family Medicine | Admitting: Family Medicine

## 2019-07-19 ENCOUNTER — Other Ambulatory Visit: Payer: Self-pay

## 2019-07-19 DIAGNOSIS — Z1231 Encounter for screening mammogram for malignant neoplasm of breast: Secondary | ICD-10-CM

## 2019-09-20 ENCOUNTER — Encounter: Payer: Self-pay | Admitting: Gynecology

## 2020-01-08 ENCOUNTER — Ambulatory Visit (HOSPITAL_COMMUNITY)
Admission: EM | Admit: 2020-01-08 | Discharge: 2020-01-08 | Disposition: A | Discharge status: Home / Self Care | Payer: Medicare Other | Attending: Family Medicine | Admitting: Family Medicine

## 2020-01-08 ENCOUNTER — Other Ambulatory Visit: Payer: Self-pay

## 2020-01-08 ENCOUNTER — Encounter (HOSPITAL_COMMUNITY): Payer: Self-pay

## 2020-01-08 DIAGNOSIS — M797 Fibromyalgia: Secondary | ICD-10-CM | POA: Insufficient documentation

## 2020-01-08 DIAGNOSIS — Z853 Personal history of malignant neoplasm of breast: Secondary | ICD-10-CM | POA: Insufficient documentation

## 2020-01-08 DIAGNOSIS — Z888 Allergy status to other drugs, medicaments and biological substances status: Secondary | ICD-10-CM | POA: Diagnosis not present

## 2020-01-08 DIAGNOSIS — I10 Essential (primary) hypertension: Secondary | ICD-10-CM

## 2020-01-08 DIAGNOSIS — Z79899 Other long term (current) drug therapy: Secondary | ICD-10-CM | POA: Insufficient documentation

## 2020-01-08 DIAGNOSIS — G43909 Migraine, unspecified, not intractable, without status migrainosus: Secondary | ICD-10-CM

## 2020-01-08 DIAGNOSIS — F418 Other specified anxiety disorders: Secondary | ICD-10-CM | POA: Insufficient documentation

## 2020-01-08 DIAGNOSIS — G43811 Other migraine, intractable, with status migrainosus: Secondary | ICD-10-CM | POA: Diagnosis not present

## 2020-01-08 DIAGNOSIS — Z20822 Contact with and (suspected) exposure to covid-19: Secondary | ICD-10-CM | POA: Insufficient documentation

## 2020-01-08 DIAGNOSIS — Z880 Allergy status to penicillin: Secondary | ICD-10-CM | POA: Diagnosis not present

## 2020-01-08 HISTORY — DX: Other specified anxiety disorders: F41.8

## 2020-01-08 HISTORY — DX: Migraine, unspecified, not intractable, without status migrainosus: G43.909

## 2020-01-08 HISTORY — DX: Essential (primary) hypertension: I10

## 2020-01-08 MED ORDER — HYDROCODONE-ACETAMINOPHEN 7.5-325 MG PO TABS: 1.0000 | ORAL_TABLET | Freq: Four times a day (QID) | ORAL | 0 refills | Status: DC | PRN

## 2020-01-08 MED ORDER — ONDANSETRON 8 MG PO TBDP: 8.0000 mg | ORAL_TABLET | Freq: Three times a day (TID) | ORAL | 0 refills | Status: DC | PRN

## 2020-01-08 NOTE — ED Provider Notes (Signed)
[facility]    CSN: [id number] Arrival date & time: 01/08/20  1250      History   Chief Complaint Chief Complaint  Patient presents with  . Allergic Reaction    HPI Kiara Wilson is a 59 y.o. female.   HPI  Patient has fibromyalgia.  Migraines.  She has a terrible migraine right now.  She went to her doctor's office a few days ago.  She was given a shot of Toradol.  This did not help.  Her doctor followed this up with prednisone.  She states after the second dose of prednisone she had hot flushing and a jittery feeling.  I explained to the patient that this is a common side effect from prednisone and that she is not allergic to it.  She is relieved to hear this but still has a terrible headache.  She has been tried on Topamax.  She is been tried on Maxalt.  She has been tried on multiple medications.  The Toradol did not help.  The only thing I know to give her wrist pain medicine even though I explained to her that narcotic pain medication is not generally indicated for migraines.  I will give her Toradol for her nausea. She does not have coronavirus symptoms and did not suspect coronavirus, but my advice will get a coronavirus test since the headache is persistent  Past Medical History:  Diagnosis Date  . Allergy   . Anxiety   . Arthritis   . Breast cancer ([place]) 09/2007  . Depression   . Depression with anxiety 01/08/2020  . Fibromyalgia   . GERD (gastroesophageal reflux disease)   . Hypertension   . Migraines   . Overactive bladder   . Personal history of radiation therapy     Patient Active Problem List   Diagnosis Date Noted  . Fibromyalgia 01/08/2020  . Migraine 01/08/2020  . Depression with anxiety 01/08/2020  . Essential hypertension 01/08/2020  . Breast cancer ([place]) 12/25/2011    Past Surgical History:  Procedure Laterality Date  . BREAST BIOPSY Left    benign  . BREAST LUMPECTOMY Right 2008  . EYE SURGERY    . PELVIC LAPAROSCOPY  1992     OB History    Gravida  0   Para      Term      Preterm      AB      Living        SAB      TAB      Ectopic      Multiple      Live Births               Home Medications    Prior to Admission medications   Medication Sig Start Date End Date Taking? Authorizing Provider  buPROPion (WELLBUTRIN XL) 150 MG 24 hr tablet  03/19/15   [provider]  clonazePAM (KLONOPIN) 1 MG tablet Take 1 mg by mouth 2 (two) times daily as needed.      [provider]  CLOTRIMAZOLE-BETAMETH & ZN OX EX Apply topically.    [provider]  cyclobenzaprine (FLEXERIL) 10 MG tablet Take 10 mg by mouth as needed for muscle spasms.    [provider]  escitalopram (LEXAPRO) 20 MG tablet  03/19/15   [provider]  HYDROcodone-acetaminophen (NORCO) 7.5-325 MG tablet Take 1 tablet by mouth every 6 (six) hours as needed for moderate pain. 01/08/20   Blanchie Serve  Collie Siad, MD  losartan (COZAAR) 50 MG tablet Take 50 mg by mouth daily.      [provider]  Multiple Vitamin (MULTIVITAMIN) tablet Take 1 tablet by mouth daily.    [provider]  ondansetron (ZOFRAN ODT) 8 MG disintegrating tablet Take 1 tablet (8 mg total) by mouth every 8 (eight) hours as needed for nausea or vomiting. 01/08/20   Raylene Everts, MD    Family History Family History  Problem Relation Age of Onset  . Diabetes Maternal Grandmother   . Diabetes Paternal Grandmother     Social History Social History   Tobacco Use  . Smoking status: Never Smoker  . Smokeless tobacco: Never Used  Substance Use Topics  . Alcohol use: No    Comment: rare  . Drug use: No     Allergies   Amoxicillin and Adhesive [tape]   Review of Systems Review of Systems  Eyes: Positive for photophobia and visual disturbance.  Gastrointestinal: Positive for nausea. Negative for vomiting.  Neurological: Positive for headaches.   See HPI Physical Exam Triage Vital  Signs ED Triage Vitals  Enc Vitals Group     BP 01/08/20 1331 (!) 158/91     Pulse Rate 01/08/20 1331 93     Resp 01/08/20 1331 18     Temp 01/08/20 1331 98.5 F (36.9 C)     Temp Source 01/08/20 1331 Oral     SpO2 01/08/20 1331 98 %     Weight 01/08/20 1329 196 lb (88.9 kg)     Height --      Head Circumference --      Peak Flow --      Pain Score 01/08/20 1329 8     Pain Loc --      Pain Edu? --      Excl. in [place]? --    No data found.  Updated Vital Signs BP (!) 158/91 (BP Location: Right Arm)   Pulse 93   Temp 98.5 F (36.9 C) (Oral)   Resp 18   Wt 88.9 kg   LMP 08/08/2012   SpO2 98%   BMI 33.38 kg/m   Visual Acuity Right Eye Distance:   Left Eye Distance:   Bilateral Distance:    Right Eye Near:   Left Eye Near:    Bilateral Near:     Physical Exam Constitutional:      General: She is not in acute distress.    Appearance: She is well-developed and normal weight. She is ill-appearing.     Comments: Patient is in obvious discomfort.  Holding her head  HENT:     Head: Normocephalic and atraumatic.     Mouth/Throat:     Comments: Mask in place Eyes:     Conjunctiva/sclera: Conjunctivae normal.     Pupils: Pupils are equal, round, and reactive to light.  Cardiovascular:     Rate and Rhythm: Normal rate.  Pulmonary:     Effort: Pulmonary effort is normal. No respiratory distress.  Musculoskeletal:        General: Normal range of motion.     Cervical back: Normal range of motion.  Skin:    General: Skin is warm and dry.  Neurological:     General: No focal deficit present.     Mental Status: She is alert. Mental status is at baseline.  Psychiatric:        Mood and Affect: Mood normal.        Behavior: Behavior normal.  UC Treatments / Results  Labs (all labs ordered are listed, but only abnormal results are displayed) Labs Reviewed  NOVEL CORONAVIRUS, NAA (HOSP ORDER, SEND-OUT TO REF LAB; TAT 18-24 HRS)    EKG   Radiology No results  found.  Procedures Procedures (including critical care time)  Medications Ordered in UC Medications - No data to display  Initial Impression / Assessment and Plan / UC Course  I have reviewed the triage vital signs and the nursing notes.  Pertinent labs & imaging results that were available during my care of the patient were reviewed by me and considered in my medical decision making (see chart for details).      Final Clinical Impressions(s) / UC Diagnoses   Final diagnoses:  Other migraine with status migrainosus, intractable     Discharge Instructions     Take the zofran as needed for nausea Take the pain medicine when pain Is severe DO NOT DRIVE on pain medicine This medicine will not be refilled See your doctor for further advice   ED Prescriptions    Medication Sig Dispense Auth. Provider   ondansetron (ZOFRAN ODT) 8 MG disintegrating tablet Take 1 tablet (8 mg total) by mouth every 8 (eight) hours as needed for nausea or vomiting. 20 tablet Raylene Everts, MD   HYDROcodone-acetaminophen [facility]) 7.5-325 MG tablet Take 1 tablet by mouth every 6 (six) hours as needed for moderate pain. 12 tablet Raylene Everts, MD     I have reviewed the PDMP during this encounter.   Raylene Everts, MD 01/08/20 [phone]

## 2020-01-08 NOTE — Discharge Instructions (Addendum)
Take the zofran as needed for nausea Take the pain medicine when pain Is severe DO NOT DRIVE on pain medicine This medicine will not be refilled See your doctor for further advice

## 2020-01-08 NOTE — ED Triage Notes (Signed)
Pt states she has been taking prednisone for 2 days. Pt thinks she's having a allergic reaction to the med. SOB and nauseous sense taking the med.

## 2020-01-10 ENCOUNTER — Encounter (HOSPITAL_BASED_OUTPATIENT_CLINIC_OR_DEPARTMENT_OTHER): Payer: Self-pay | Admitting: Emergency Medicine

## 2020-01-10 ENCOUNTER — Other Ambulatory Visit: Payer: Self-pay

## 2020-01-10 ENCOUNTER — Emergency Department (HOSPITAL_BASED_OUTPATIENT_CLINIC_OR_DEPARTMENT_OTHER)
Admission: EM | Admit: 2020-01-10 | Discharge: 2020-01-10 | Disposition: A | Discharge status: Home / Self Care | Payer: Medicare Other | Attending: Emergency Medicine | Admitting: Emergency Medicine

## 2020-01-10 DIAGNOSIS — Z79899 Other long term (current) drug therapy: Secondary | ICD-10-CM | POA: Insufficient documentation

## 2020-01-10 DIAGNOSIS — G43901 Migraine, unspecified, not intractable, with status migrainosus: Secondary | ICD-10-CM | POA: Diagnosis not present

## 2020-01-10 DIAGNOSIS — R110 Nausea: Secondary | ICD-10-CM | POA: Diagnosis not present

## 2020-01-10 DIAGNOSIS — I10 Essential (primary) hypertension: Secondary | ICD-10-CM | POA: Insufficient documentation

## 2020-01-10 DIAGNOSIS — H53149 Visual discomfort, unspecified: Secondary | ICD-10-CM | POA: Diagnosis not present

## 2020-01-10 DIAGNOSIS — Z853 Personal history of malignant neoplasm of breast: Secondary | ICD-10-CM | POA: Diagnosis not present

## 2020-01-10 DIAGNOSIS — R519 Headache, unspecified: Secondary | ICD-10-CM | POA: Diagnosis present

## 2020-01-10 LAB — NOVEL CORONAVIRUS, NAA (HOSP ORDER, SEND-OUT TO REF LAB; TAT 18-24 HRS): SARS-CoV-2, NAA: NOT DETECTED

## 2020-01-10 MED ORDER — MAGNESIUM SULFATE 2 GM/50ML IV SOLN
2.0000 g | Freq: Once | INTRAVENOUS | Status: AC
  Administered 2020-01-10: 12:00:00 2 g via INTRAVENOUS
  Filled 2020-01-10: qty 50

## 2020-01-10 MED ORDER — KETOROLAC TROMETHAMINE 30 MG/ML IJ SOLN
30.0000 mg | Freq: Once | INTRAMUSCULAR | Status: AC
  Administered 2020-01-10: 12:00:00 30 mg via INTRAVENOUS
  Filled 2020-01-10: qty 1

## 2020-01-10 MED ORDER — DIPHENHYDRAMINE HCL 50 MG/ML IJ SOLN
25.0000 mg | Freq: Once | INTRAMUSCULAR | Status: AC
  Administered 2020-01-10: 12:00:00 25 mg via INTRAVENOUS
  Filled 2020-01-10: qty 1

## 2020-01-10 MED ORDER — PROCHLORPERAZINE EDISYLATE 10 MG/2ML IJ SOLN
10.0000 mg | Freq: Once | INTRAMUSCULAR | Status: AC
  Administered 2020-01-10: 12:00:00 10 mg via INTRAVENOUS
  Filled 2020-01-10: qty 2

## 2020-01-10 MED ORDER — CYCLOBENZAPRINE HCL 10 MG PO TABS
10.0000 mg | ORAL_TABLET | Freq: Once | ORAL | Status: AC
  Administered 2020-01-10: 12:00:00 10 mg via ORAL
  Filled 2020-01-10: qty 1

## 2020-01-10 MED ORDER — SODIUM CHLORIDE 0.9 % IV BOLUS
1000.0000 mL | Freq: Once | INTRAVENOUS | Status: AC
  Administered 2020-01-10: 12:00:00 1000 mL via INTRAVENOUS

## 2020-01-10 NOTE — ED Notes (Addendum)
PT obtained urine sample

## 2020-01-10 NOTE — ED Provider Notes (Signed)
[facility] Provider Note   CSN: [id number] Arrival date & time: 01/10/20  [id number]     History Chief Complaint  Patient presents with  . Headache    Kiara Wilson is a 59 y.o. female.  59 year old female with past medical history below including migraines, depression/anxiety, fibromyalgia, distant history of breast cancer who presents with migraine.  Patient states that 2 weeks ago, she began having a headache that is all around her head and feels like previous episodes of migraine.  One time in the past she had a migraine that lasted a month.  She reports associated photophobia and nausea, no vomiting or vision changes.  No fevers or recent illness.  She has tried multiple medications at home without relief.  She was seen by PCP and given Toradol and prednisone.  No improvement in symptoms and she felt flushed with the prednisone so she has since stopped the medication.  She tried a muscle relaxant but she did not like the way it made her feel.  She has not taken any medications today for her symptoms.  The history is provided by the patient.  Headache      Past Medical History:  Diagnosis Date  . Allergy   . Anxiety   . Arthritis   . Breast cancer ([place]) 09/2007  . Depression   . Depression with anxiety 01/08/2020  . Fibromyalgia   . GERD (gastroesophageal reflux disease)   . Hypertension   . Migraines   . Overactive bladder   . Personal history of radiation therapy     Patient Active Problem List   Diagnosis Date Noted  . Fibromyalgia 01/08/2020  . Migraine 01/08/2020  . Depression with anxiety 01/08/2020  . Essential hypertension 01/08/2020  . Breast cancer ([place]) 12/25/2011    Past Surgical History:  Procedure Laterality Date  . BREAST BIOPSY Left    benign  . BREAST LUMPECTOMY Right 2008  . EYE SURGERY    . PELVIC LAPAROSCOPY  1992     OB History    Gravida  0   Para      Term      Preterm      AB      Living        SAB      TAB      Ectopic      Multiple      Live Births              Family History  Problem Relation Age of Onset  . Diabetes Maternal Grandmother   . Diabetes Paternal Grandmother     Social History   Tobacco Use  . Smoking status: Never Smoker  . Smokeless tobacco: Never Used  Substance Use Topics  . Alcohol use: No    Comment: rare  . Drug use: No    Home Medications Prior to Admission medications   Medication Sig Start Date End Date Taking? Authorizing Provider  buPROPion (WELLBUTRIN XL) 150 MG 24 hr tablet  03/19/15   [provider]  clonazePAM (KLONOPIN) 1 MG tablet Take 1 mg by mouth 2 (two) times daily as needed.      [provider]  CLOTRIMAZOLE-BETAMETH & ZN OX EX Apply topically.    [provider]  cyclobenzaprine (FLEXERIL) 10 MG tablet Take 10 mg by mouth as needed for muscle spasms.    [provider]  escitalopram (LEXAPRO) 20 MG tablet  03/19/15   [provider]  HYDROcodone-acetaminophen (NORCO) 7.5-325 MG tablet Take 1 tablet by mouth every 6 (six) hours as needed for moderate pain. 01/08/20   Raylene Everts, MD  losartan (COZAAR) 50 MG tablet Take 50 mg by mouth daily.      [provider]  Multiple Vitamin (MULTIVITAMIN) tablet Take 1 tablet by mouth daily.    [provider]  ondansetron (ZOFRAN ODT) 8 MG disintegrating tablet Take 1 tablet (8 mg total) by mouth every 8 (eight) hours as needed for nausea or vomiting. 01/08/20   Raylene Everts, MD    Allergies    Amoxicillin and Adhesive [tape]  Review of Systems   Review of Systems  Neurological: Positive for headaches.   All other systems reviewed and are negative except that which was mentioned in HPI  Physical Exam Updated Vital Signs BP (!) 138/98 (BP Location: Right Arm)   Pulse (!) 105   Temp 98.6 F (37 C) (Oral)   Resp 16   Ht 5\' 4"  (1.626 m)   Wt 88.9 kg   LMP 08/08/2012   SpO2 97%   BMI 33.64  kg/m   Physical Exam Vitals and nursing note reviewed.  Constitutional:      Appearance: She is well-developed.     Comments: Awake, wearing sunglasses in dark room, holding head, uncomfortable  HENT:     Head: Normocephalic and atraumatic.  Eyes:     Extraocular Movements: Extraocular movements intact.     Conjunctiva/sclera: Conjunctivae normal.     Pupils: Pupils are equal, round, and reactive to light.  Cardiovascular:     Rate and Rhythm: Normal rate and regular rhythm.     Heart sounds: Normal heart sounds. No murmur.  Pulmonary:     Effort: Pulmonary effort is normal. No respiratory distress.     Breath sounds: Normal breath sounds.  Abdominal:     General: Bowel sounds are normal. There is no distension.     Palpations: Abdomen is soft.     Tenderness: There is no abdominal tenderness.  Musculoskeletal:     Cervical back: Neck supple.  Skin:    General: Skin is warm and dry.  Neurological:     Mental Status: She is alert and oriented to person, place, and time.     Cranial Nerves: No cranial nerve deficit.     Motor: No abnormal muscle tone.     Deep Tendon Reflexes: Reflexes are normal and symmetric. Reflexes normal.     Comments: Fluent speech, normal finger-to-nose testing, negative pronator drift, no clonus 5/5 strength and normal sensation x all 4 extremities Normal gait  Psychiatric:        Mood and Affect: Mood normal.        Speech: Speech normal.        Thought Content: Thought content normal.        Judgment: Judgment normal.     ED Results / Procedures / Treatments   Labs (all labs ordered are listed, but only abnormal results are displayed) Labs Reviewed - No data to display  EKG None  Radiology No results found.  Procedures Procedures (including critical care time)  Medications Ordered in ED Medications  magnesium sulfate IVPB 2 g 50 mL (0 g Intravenous Stopped 01/10/20 1315)  ketorolac (TORADOL) 30 MG/ML injection 30 mg (30 mg  Intravenous Given 01/10/20 1206)  diphenhydrAMINE (BENADRYL) injection 25 mg (25 mg Intravenous Given 01/10/20 1204)  prochlorperazine (COMPAZINE) injection 10 mg (10 mg Intravenous Given 01/10/20 1208)  cyclobenzaprine (  FLEXERIL) tablet 10 mg (10 mg Oral Given 01/10/20 1211)  sodium chloride 0.9 % bolus 1,000 mL (0 mLs Intravenous Stopped 01/10/20 1315)    ED Course  I have reviewed the triage vital signs and the nursing notes.      MDM Rules/Calculators/A&P                      Neuro intact on exam, afebrile, no meningismus. No vision changes and given h/o migraines that are similar, I doubt IIH or temporal arteritis. Gave above medications and on reassessment, pt states that headache is completely resolved. She appears much better and feels comfortable going home. I have discussed supportive measures.  She has seen a neurologist previously and I recommended that she contact clinic for follow-up appointment to discuss ongoing management.  Return precautions reviewed and she voiced understanding. Final Clinical Impression(s) / ED Diagnoses Final diagnoses:  Migraine with status migrainosus, not intractable, unspecified migraine type    Rx / DC Orders ED Discharge Orders    None       Haleemah Buckalew, Wenda Overland, MD 01/10/20 1414

## 2020-01-10 NOTE — ED Triage Notes (Signed)
Headache x 15 days with nausea. Has been seen by PCP and UC without relief.

## 2020-01-19 ENCOUNTER — Other Ambulatory Visit: Payer: Self-pay | Admitting: Family Medicine

## 2020-01-19 DIAGNOSIS — Z1231 Encounter for screening mammogram for malignant neoplasm of breast: Secondary | ICD-10-CM

## 2020-01-24 NOTE — Progress Notes (Signed)
[facility]    Provider:  Dr Jaynee Eagles Requesting Provider: Caren Macadam, MD Primary Care Provider:  Carol Ada, MD  CC:  Persistent headache for 3 weeks  HPI:  Kiara Wilson is a 59 y.o. female here as requested by Caren Macadam, MD for headache.PMHx migraine, depression, anxiety, breast cancer 2008, HTN, panic attacks, right breast lumpectomy.  I reviewed patient's chart, she was recently seen at the emergency room last month for migraine, she had been having a migraine for 2 weeks, associated photophobia, nausea no vomiting or vision changes, no fevers or recent illness, she had tried multiple medications at home without relief, she was given prednisone by PCP, she felt flushed with steroids so she stopped the medication, muscle relaxers made her feel funny.  I reviewed her physical and neurologic exam, she appeared uncomfortable, holding head wearing sunglasses in a dark room otherwise exam was nonfocal and neurologic exam was also normal.  She is given a migraine cocktail of Benadryl, Toradol, Compazine, Flexeril, fluids and magnesium sulfate with complete resolution of headache. I also reviewed Dr. Thompson Caul notes, patient's been on triptan's and naproxen in the past, when this headache started she was under a lot of stress, friends with Covid, good elderly friend dying, and this has been trigger for her migraines, typically she does not have that many.  Also reported nausea, headache low energy.   Patient reports she has had a headache for 3 weeks. Nothing has helped. Usually she does not have headaches but she will have episodes of a prolonged headache she had similar int he past and saw Dr. Tomi Likens. I reviewed Dr. Georgie Chard notes and she was seen in 2018 and diagnosed with Trigeminal Neuralgia vs a "manifestation of fibromyalgia". No imaging was performed. She has been having a headache for 3 weeks, The headaches started in January, she is sitting in the dark today in the  exam room, started as a headache, became worse, pounding, ears hurting, one side then the other then frontal then the back of the head, never got better, she went to the pcp and the ED and she has had some temporary relief but the headache came back the same day. She reports she has a history of migraines but this is unusual, in the past she would take excedrin every few months, this is unusual, she had a prolonged headache in 2014, she does not often have headaches so bad. She has seen a neurologist I the past Dr. Santo Held and he gave her topamax for the migraines she can't remember. Usually the excedrin works if not a bad headache. She tried caffeine with 2 tylenols and came back in 2 hours. The headaches are positional, the right eye is in a lot of pin with vision changes. Also dizziness, vertigo, worse bending over and moving around. No other focal neurologic deficits, associated symptoms, inciting events or modifiable factors.  Labs 08/2019 hgba1c 6, BUN 13, Creatinine 0.78 Medications tried include: Lyrica, Paxil, naproxen, trazodone, Cymbalta, verapamil, meloxicam, prednisone, tramadol, triptans, topiramate  Review of Systems: Patient complains of symptoms per HPI as well as the following symptoms: headache. Pertinent negatives and positives per HPI. All others negative.   Social History   Socioeconomic History  . Marital status: Legally Separated    Spouse name: Not on file  . Number of children: 0  . Years of education: 25  . Highest education level: Not on file  Occupational History  . Not on file  Tobacco Use  . Smoking  status: Never Smoker  . Smokeless tobacco: Never Used  Substance and Sexual Activity  . Alcohol use: No    Comment: rare  . Drug use: No  . Sexual activity: Yes    Birth control/protection: Post-menopausal  Other Topics Concern  . Not on file  Social History Narrative   Currently legally separated   Lives alone   Right handed   Caffeine: only drinks decaf d/t  palpitations with caffeine   Social Determinants of Health   Financial Resource Strain:   . Difficulty of Paying Living Expenses: Not on file  Food Insecurity:   . Worried About Charity fundraiser in the Last Year: Not on file  . Ran Out of Food in the Last Year: Not on file  Transportation Needs:   . Lack of Transportation (Medical): Not on file  . Lack of Transportation (Non-Medical): Not on file  Physical Activity:   . Days of Exercise per Week: Not on file  . Minutes of Exercise per Session: Not on file  Stress:   . Feeling of Stress : Not on file  Social Connections:   . Frequency of Communication with Friends and Family: Not on file  . Frequency of Social Gatherings with Friends and Family: Not on file  . Attends Religious Services: Not on file  . Active Member of Clubs or Organizations: Not on file  . Attends Archivist Meetings: Not on file  . Marital Status: Not on file  Intimate Partner Violence:   . Fear of Current or Ex-Partner: Not on file  . Emotionally Abused: Not on file  . Physically Abused: Not on file  . Sexually Abused: Not on file    Family History  Problem Relation Age of Onset  . Migraines Mother   . Depression Mother   . Fibromyalgia Mother   . High blood pressure Father   . Diabetes Maternal Grandmother   . Diabetes Paternal Grandmother     Past Medical History:  Diagnosis Date  . Allergy   . Anxiety   . Arthritis   . Breast cancer ([place]) 09/2007  . Depression   . Depression with anxiety 01/08/2020  . Fibromyalgia   . GERD (gastroesophageal reflux disease)   . Hypertension   . Migraines   . Overactive bladder   . Personal history of radiation therapy     Patient Active Problem List   Diagnosis Date Noted  . Fibromyalgia 01/08/2020  . Migraine 01/08/2020  . Depression with anxiety 01/08/2020  . Essential hypertension 01/08/2020  . Breast cancer ([place]) 12/25/2011    Past Surgical History:  Procedure Laterality Date  .  BREAST BIOPSY Left    benign  . BREAST LUMPECTOMY Right 2008  . EYE SURGERY    . PELVIC LAPAROSCOPY  1992    Current Outpatient Medications  Medication Sig Dispense Refill  . APPLE CIDER VINEGAR PO Take 2 each by mouth daily.    . Ascorbic Acid (VITAMIN C) 1000 MG tablet Take 1,000 mg by mouth daily.    . ASHWAGANDHA PO Take by mouth daily.    . cetirizine (ZYRTEC) 10 MG tablet Take 10 mg by mouth daily.    . Cholecalciferol (VITAMIN D3 PO) Take 5,000 Int'l Units by mouth daily.    . clonazePAM (KLONOPIN) 1 MG tablet Take 1 mg by mouth 2 (two) times daily as needed.      Marland Kitchen CLOTRIMAZOLE-BETAMETH & ZN OX EX Apply topically.    . Coenzyme Q10 (CO Q  10 PO) Take by mouth daily.    . Cyanocobalamin (VITAMIN B-12 PO) Take 500 mcg by mouth daily.    . DULoxetine (CYMBALTA) 60 MG capsule Take 60 mg by mouth daily.    Marland Kitchen losartan (COZAAR) 50 MG tablet Take 50 mg by mouth daily.      . meloxicam (MOBIC) 15 MG tablet Take 15 mg by mouth as needed.    . Omega-3 1000 MG CAPS Take by mouth daily.    . Probiotic Product (PROBIOTIC PO) Take by mouth daily.    . SELENIUM PO Take 200 mcg by mouth daily.    . traMADol (ULTRAM) 50 MG tablet as needed.    . vitamin E (VITAMIN E) 180 MG (400 UNITS) capsule Take 400 Units by mouth daily.    . ondansetron (ZOFRAN-ODT) 4 MG disintegrating tablet Take 1 tablet (4 mg total) by mouth every 8 (eight) hours as needed for nausea. 30 tablet 3  . rizatriptan (MAXALT-MLT) 10 MG disintegrating tablet Take 1 tablet (10 mg total) by mouth as needed for migraine. May repeat in 2 hours if needed 9 tablet 11   No current facility-administered medications for this visit.    Allergies as of 01/25/2020 - Review Complete 01/25/2020  Allergen Reaction Noted  . Amoxicillin  10/17/2011  . Adhesive [tape] Rash 05/09/2015    Vitals: BP (!) 141/96 (BP Location: Left Arm, Patient Position: Sitting)   Pulse 92   Temp 97.7 F (36.5 C) Comment: taken at front  Ht 5\' 4"  (1.626 m)    Wt 195 lb (88.5 kg)   LMP 08/08/2012   BMI 33.47 kg/m  Last Weight:  Wt Readings from Last 1 Encounters:  01/25/20 195 lb (88.5 kg)   Last Height:   Ht Readings from Last 1 Encounters:  01/25/20 5\' 4"  (1.626 m)     Physical exam: Exam: Gen: NAD, conversant, well nourised, obese, well groomed                     CV: RRR, no MRG. No Carotid Bruits. No peripheral edema, warm, nontender Eyes: Conjunctivae clear without exudates or hemorrhage  Neuro: Detailed Neurologic Exam  Speech:    Speech is normal; fluent and spontaneous with normal comprehension.  Cognition:    The patient is oriented to person, place, and time;     recent and remote memory intact;     language fluent;     normal attention, concentration,     fund of knowledge Cranial Nerves:    The pupils are equal, round, and reactive to light. Could not visualize fundi due to photophobia. Visual fields are full to finger confrontation. Extraocular movements are intact. Trigeminal sensation is intact and the muscles of mastication are normal. The face is symmetric. The palate elevates in the midline. Hearing intact. Voice is normal. Shoulder shrug is normal. The tongue has normal motion without fasciculations.   Coordination:    No dysmetria  Gait:    Normal native gait  Motor Observation:    No asymmetry, no atrophy, and no involuntary movements noted. Tone:    Normal muscle tone.    Posture:    Posture is normal. normal erect    Strength:    Strength is V/V in the upper and lower limbs.      Sensation: intact to LT     Reflex Exam:  DTR's:    Deep tendon reflexes in the upper and lower symmetrical are normal bilaterally.   Toes:  The toes are equiv bilaterally.   Clonus:    Clonus is absent.    Assessment/Plan: This is a 59 year old female with a past medical history of episodic migraines who has been recently under much stress, patient with a migraine for the last month, intractable, status  migrainosus.  She has been to her primary care, she has been to the emergency room.  She had a migraine treatment in the emergency room which helped but the headache came right back.  Really to start her on a daily preventative given her usual frequency of migraines or every other month, this prolonged migraine may be due to stress and status migrainosus, today will also give her a migraine cocktail with several other medications including Depacon, Toradol, Compazine, Benadryl, will also give her Solu-Medrol, in the past she states she does not have an allergy to prednisone, she had a slight reaction in the past to it which was not an allergy, at this time we will give her the lowest dose 125 mg because adding the steroids helps stop the headache from rebounding which is what has happened to her on multiple occasions.  Also given concerning symptoms of vision changes, prolonged positional headache, right eye orbital pain, I do feel an MRI of the brain with and without contrast at this time would be very prudent I do not see any imaging in the past,  to look for space occupying mass, chiari or intracranial hypertension (pseudotumor) or other. I will give her Maxalt and Zofran I will give her Maxalt and Zofran acutely for migraines in the future so that hopefully will not become this prolonged  Orders Placed This Encounter  Procedures  . MR BRAIN W WO CONTRAST  (Labs 08/2019 hgba1c 6, BUN 13, Creatinine 0.78)  Meds ordered this encounter  Medications  . rizatriptan (MAXALT-MLT) 10 MG disintegrating tablet    Sig: Take 1 tablet (10 mg total) by mouth as needed for migraine. May repeat in 2 hours if needed    Dispense:  9 tablet    Refill:  11  . ondansetron (ZOFRAN-ODT) 4 MG disintegrating tablet    Sig: Take 1 tablet (4 mg total) by mouth every 8 (eight) hours as needed for nausea.    Dispense:  30 tablet    Refill:  3    Cc: Carol Ada, MD,   Caren Macadam, MD  Sarina Ill, MD  [facility]  [facility] [address] [place], [place] [address]  Phone [phone] Fax [phone]

## 2020-01-25 ENCOUNTER — Other Ambulatory Visit: Payer: Self-pay

## 2020-01-25 ENCOUNTER — Ambulatory Visit: Payer: Medicare Other | Admitting: Neurology

## 2020-01-25 ENCOUNTER — Encounter: Payer: Self-pay | Admitting: Neurology

## 2020-01-25 VITALS — BP 141/96 | HR 92 | Temp 97.7°F | Ht 64.0 in | Wt 195.0 lb

## 2020-01-25 DIAGNOSIS — G441 Vascular headache, not elsewhere classified: Secondary | ICD-10-CM

## 2020-01-25 DIAGNOSIS — R519 Headache, unspecified: Secondary | ICD-10-CM

## 2020-01-25 DIAGNOSIS — H5711 Ocular pain, right eye: Secondary | ICD-10-CM | POA: Diagnosis not present

## 2020-01-25 DIAGNOSIS — H539 Unspecified visual disturbance: Secondary | ICD-10-CM

## 2020-01-25 DIAGNOSIS — G43009 Migraine without aura, not intractable, without status migrainosus: Secondary | ICD-10-CM

## 2020-01-25 DIAGNOSIS — R51 Headache with orthostatic component, not elsewhere classified: Secondary | ICD-10-CM

## 2020-01-25 MED ORDER — RIZATRIPTAN BENZOATE 10 MG PO TBDP: 10.0000 mg | ORAL_TABLET | ORAL | 11 refills | Status: DC | PRN

## 2020-01-25 MED ORDER — ONDANSETRON 4 MG PO TBDP: 4.0000 mg | ORAL_TABLET | Freq: Three times a day (TID) | ORAL | 3 refills | Status: DC | PRN

## 2020-01-25 NOTE — Patient Instructions (Signed)
Rizatriptan: Please take one tablet at the onset of your headache. If it does not improve the symptoms please take one additional tablet. Do not take more then 2 tablets in 24hrs. Do not take use more then 2 to 3 times in a week. Ondansetron: for nausea and migraines, may take with maxalt MRI brain - we will call   Migraine Headache A migraine headache is an intense, throbbing pain on one side or both sides of the head. Migraine headaches may also cause other symptoms, such as nausea, vomiting, and sensitivity to light and noise. A migraine headache can last from 4 hours to 3 days. Talk with your doctor about what things may bring on (trigger) your migraine headaches. What are the causes? The exact cause of this condition is not known. However, a migraine may be caused when nerves in the brain become irritated and release chemicals that cause inflammation of blood vessels. This inflammation causes pain. This condition may be triggered or caused by:  Drinking alcohol.  Smoking.  Taking medicines, such as: ? Medicine used to treat chest pain (nitroglycerin). ? Birth control pills. ? Estrogen. ? Certain blood pressure medicines.  Eating or drinking products that contain nitrates, glutamate, aspartame, or tyramine. Aged cheeses, chocolate, or caffeine may also be triggers.  Doing physical activity. Other things that may trigger a migraine headache include:  Menstruation.  Pregnancy.  Hunger.  Stress.  Lack of sleep or too much sleep.  Weather changes.  Fatigue. What increases the risk? The following factors may make you more likely to experience migraine headaches:  Being a certain age. This condition is more common in people who are 24-23 years old.  Being female.  Having a family history of migraine headaches.  Being Caucasian.  Having a mental health condition, such as depression or anxiety.  Being obese. What are the signs or symptoms? The main symptom of this  condition is pulsating or throbbing pain. This pain may:  Happen in any area of the head, such as on one side or both sides.  Interfere with daily activities.  Get worse with physical activity.  Get worse with exposure to bright lights or loud noises. Other symptoms may include:  Nausea.  Vomiting.  Dizziness.  General sensitivity to bright lights, loud noises, or smells. Before you get a migraine headache, you may get warning signs (an aura). An aura may include:  Seeing flashing lights or having blind spots.  Seeing bright spots, halos, or zigzag lines.  Having tunnel vision or blurred vision.  Having numbness or a tingling feeling.  Having trouble talking.  Having muscle weakness. Some people have symptoms after a migraine headache (postdromal phase), such as:  Feeling tired.  Difficulty concentrating. How is this diagnosed? A migraine headache can be diagnosed based on:  Your symptoms.  A physical exam.  Tests, such as: ? CT scan or an MRI of the head. These imaging tests can help rule out other causes of headaches. ? Taking fluid from the spine (lumbar puncture) and analyzing it (cerebrospinal fluid analysis, or CSF analysis). How is this treated? This condition may be treated with medicines that:  Relieve pain.  Relieve nausea.  Prevent migraine headaches. Treatment for this condition may also include:  Acupuncture.  Lifestyle changes like avoiding foods that trigger migraine headaches.  Biofeedback.  Cognitive behavioral therapy. Follow these instructions at home: Medicines  Take over-the-counter and prescription medicines only as told by your health care provider.  Ask your health care provider  if the medicine prescribed to you: ? Requires you to avoid driving or using heavy machinery. ? Can cause constipation. You may need to take these actions to prevent or treat constipation:  Drink enough fluid to keep your urine pale yellow.  Take  over-the-counter or prescription medicines.  Eat foods that are high in fiber, such as beans, whole grains, and fresh fruits and vegetables.  Limit foods that are high in fat and processed sugars, such as fried or sweet foods. Lifestyle  Do not drink alcohol.  Do not use any products that contain nicotine or tobacco, such as cigarettes, e-cigarettes, and chewing tobacco. If you need help quitting, ask your health care provider.  Get at least 8 hours of sleep every night.  Find ways to manage stress, such as meditation, deep breathing, or yoga. General instructions      Keep a journal to find out what may trigger your migraine headaches. For example, write down: ? What you eat and drink. ? How much sleep you get. ? Any change to your diet or medicines.  If you have a migraine headache: ? Avoid things that make your symptoms worse, such as bright lights. ? It may help to lie down in a dark, quiet room. ? Do not drive or use heavy machinery. ? Ask your health care provider what activities are safe for you while you are experiencing symptoms.  Keep all follow-up visits as told by your health care provider. This is important. Contact a health care provider if:  You develop symptoms that are different or more severe than your usual migraine headache symptoms.  You have more than 15 headache days in one month. Get help right away if:  Your migraine headache becomes severe.  Your migraine headache lasts longer than 72 hours.  You have a fever.  You have a stiff neck.  You have vision loss.  Your muscles feel weak or like you cannot control them.  You start to lose your balance often.  You have trouble walking.  You faint.  You have a seizure. Summary  A migraine headache is an intense, throbbing pain on one side or both sides of the head. Migraines may also cause other symptoms, such as nausea, vomiting, and sensitivity to light and noise.  This condition may be  treated with medicines and lifestyle changes. You may also need to avoid certain things that trigger a migraine headache.  Keep a journal to find out what may trigger your migraine headaches.  Contact your health care provider if you have more than 15 headache days in a month or you develop symptoms that are different or more severe than your usual migraine headache symptoms. This information is not intended to replace advice given to you by your health care provider. Make sure you discuss any questions you have with your health care provider. Document Revised: 04/01/2019 Document Reviewed: 01/20/2019 Elsevier Patient Education  [place]. Ondansetron oral dissolving tablet What is this medicine? ONDANSETRON (on DAN se tron) is used to treat nausea and vomiting caused by chemotherapy. It is also used to prevent or treat nausea and vomiting after surgery. This medicine may be used for other purposes; ask your health care provider or pharmacist if you have questions. COMMON BRAND NAME(S): Zofran ODT What should I tell my health care provider before I take this medicine? They need to know if you have any of these conditions:  heart disease  history of irregular heartbeat  liver disease  low levels of magnesium or potassium in the blood  an unusual or allergic reaction to ondansetron, granisetron, other medicines, foods, dyes, or preservatives  pregnant or trying to get pregnant  breast-feeding How should I use this medicine? These tablets are made to dissolve in the mouth. Do not try to push the tablet through the foil backing. With dry hands, peel away the foil backing and gently remove the tablet. Place the tablet in the mouth and allow it to dissolve, then swallow. While you may take these tablets with water, it is not necessary to do so. Talk to your pediatrician regarding the use of this medicine in children. Special care may be needed. Overdosage: If you think you have  taken too much of this medicine contact a poison control center or emergency room at once. NOTE: This medicine is only for you. Do not share this medicine with others. What if I miss a dose? If you miss a dose, take it as soon as you can. If it is almost time for your next dose, take only that dose. Do not take double or extra doses. What may interact with this medicine? Do not take this medicine with any of the following medications:  apomorphine  certain medicines for fungal infections like fluconazole, itraconazole, ketoconazole, posaconazole, voriconazole  cisapride  dronedarone  pimozide  thioridazine This medicine may also interact with the following medications:  carbamazepine  certain medicines for depression, anxiety, or psychotic disturbances  fentanyl  linezolid  MAOIs like Carbex, Eldepryl, Marplan, Nardil, and Parnate  methylene blue (injected into a vein)  other medicines that prolong the QT interval (cause an abnormal heart rhythm) like dofetilide, ziprasidone  phenytoin  rifampicin  tramadol This list may not describe all possible interactions. Give your health care provider a list of all the medicines, herbs, non-prescription drugs, or dietary supplements you use. Also tell them if you smoke, drink alcohol, or use illegal drugs. Some items may interact with your medicine. What should I watch for while using this medicine? Check with your doctor or health care professional as soon as you can if you have any sign of an allergic reaction. What side effects may I notice from receiving this medicine? Side effects that you should report to your doctor or health care professional as soon as possible:  allergic reactions like skin rash, itching or hives, swelling of the face, lips, or tongue  breathing problems  confusion  dizziness  fast or irregular heartbeat  feeling faint or lightheaded, falls  fever and chills  loss of balance or coordination   seizures  sweating  swelling of the hands and feet  tightness in the chest  tremors  unusually weak or tired Side effects that usually do not require medical attention (report to your doctor or health care professional if they continue or are bothersome):  constipation or diarrhea  headache This list may not describe all possible side effects. Call your doctor for medical advice about side effects. You may report side effects to FDA at 1-800-FDA-1088. Where should I keep my medicine? Keep out of the reach of children. Store between 2 and 30 degrees C (36 and 86 degrees F). Throw away any unused medicine after the expiration date. NOTE: This sheet is a summary. It may not cover all possible information. If you have questions about this medicine, talk to your doctor, pharmacist, or health care provider.  2020 Elsevier/Gold Standard (2018-11-30 07:14:10)  Rizatriptan disintegrating tablets What is this medicine? RIZATRIPTAN (rye  za TRIP tan) is used to treat migraines with or without aura. An aura is a strange feeling or visual disturbance that warns you of an attack. It is not used to prevent migraines. This medicine may be used for other purposes; ask your health care provider or pharmacist if you have questions. COMMON BRAND NAME(S): Maxalt-MLT What should I tell my health care provider before I take this medicine? They need to know if you have any of these conditions:  cigarette smoker  circulation problems in fingers and toes  diabetes  heart disease  high blood pressure  high cholesterol  history of irregular heartbeat  history of stroke  kidney disease  liver disease  stomach or intestine problems  an unusual or allergic reaction to rizatriptan, other medicines, foods, dyes, or preservatives  pregnant or trying to get pregnant  breast-feeding How should I use this medicine? Take this medicine by mouth. Follow the directions on the prescription label.  Leave the tablet in the sealed blister pack until you are ready to take it. With dry hands, open the blister and gently remove the tablet. If the tablet breaks or crumbles, throw it away and take a new tablet out of the blister pack. Place the tablet in the mouth and allow it to dissolve, and then swallow. Do not cut, crush, or chew this medicine. You do not need water to take this medicine. Do not take it more often than directed. Talk to your pediatrician regarding the use of this medicine in children. While this drug may be prescribed for children as young as 6 years for selected conditions, precautions do apply. Overdosage: If you think you have taken too much of this medicine contact a poison control center or emergency room at once. NOTE: This medicine is only for you. Do not share this medicine with others. What if I miss a dose? This does not apply. This medicine is not for regular use. What may interact with this medicine? Do not take this medicine with any of the following medicines:  certain medicines for migraine headache like almotriptan, eletriptan, frovatriptan, naratriptan, rizatriptan, sumatriptan, zolmitriptan  ergot alkaloids like dihydroergotamine, ergonovine, ergotamine, methylergonovine  MAOIs like Carbex, Eldepryl, Marplan, Nardil, and Parnate This medicine may also interact with the following medications:  certain medicines for depression, anxiety, or psychotic disorders  propranolol This list may not describe all possible interactions. Give your health care provider a list of all the medicines, herbs, non-prescription drugs, or dietary supplements you use. Also tell them if you smoke, drink alcohol, or use illegal drugs. Some items may interact with your medicine. What should I watch for while using this medicine? Visit your healthcare professional for regular checks on your progress. Tell your healthcare professional if your symptoms do not start to get better or if  they get worse. You may get drowsy or dizzy. Do not drive, use machinery, or do anything that needs mental alertness until you know how this medicine affects you. Do not stand up or sit up quickly, especially if you are an older patient. This reduces the risk of dizzy or fainting spells. Alcohol may interfere with the effect of this medicine. Your mouth may get dry. Chewing sugarless gum or sucking hard candy and drinking plenty of water may help. Contact your healthcare professional if the problem does not go away or is severe. If you take migraine medicines for 10 or more days a month, your migraines may get worse. Keep a diary of headache days  and medicine use. Contact your healthcare professional if your migraine attacks occur more frequently. What side effects may I notice from receiving this medicine? Side effects that you should report to your doctor or health care professional as soon as possible:  allergic reactions like skin rash, itching or hives, swelling of the face, lips, or tongue  chest pain or chest tightness  signs and symptoms of a dangerous change in heartbeat or heart rhythm like chest pain; dizziness; fast, irregular heartbeat; palpitations; feeling faint or lightheaded; falls; breathing problems  signs and symptoms of a stroke like changes in vision; confusion; trouble speaking or understanding; severe headaches; sudden numbness or weakness of the face, arm or leg; trouble walking; dizziness; loss of balance or coordination  signs and symptoms of serotonin syndrome like irritable; confusion; diarrhea; fast or irregular heartbeat; muscle twitching; stiff muscles; trouble walking; sweating; high fever; seizures; chills; vomiting Side effects that usually do not require medical attention (report to your doctor or health care professional if they continue or are bothersome):  diarrhea  dizziness  drowsiness  dry mouth  headache  nausea, vomiting  pain, tingling,  numbness in the hands or feet  stomach pain This list may not describe all possible side effects. Call your doctor for medical advice about side effects. You may report side effects to FDA at 1-800-FDA-1088. Where should I keep my medicine? Keep out of the reach of children. Store at room temperature between 15 and 30 degrees C (59 and 86 degrees F). Protect from light and moisture. Throw away any unused medicine after the expiration date. NOTE: This sheet is a summary. It may not cover all possible information. If you have questions about this medicine, talk to your doctor, pharmacist, or health care provider.  2020 Elsevier/Gold Standard (2018-06-22 14:58:08)

## 2020-01-27 ENCOUNTER — Telehealth: Payer: Self-pay | Admitting: Neurology

## 2020-01-27 NOTE — Telephone Encounter (Signed)
[employer] medicare order sent to [facility]. No auth they will reach out to the patient to schedule.  

## 2020-01-31 ENCOUNTER — Telehealth: Payer: Self-pay | Admitting: Neurology

## 2020-01-31 NOTE — Telephone Encounter (Signed)
Wonderful. 

## 2020-01-31 NOTE — Telephone Encounter (Signed)
This is FYI, pt just wants Dr Jaynee Eagles to know she is doing great, no call back requested

## 2020-02-01 NOTE — Telephone Encounter (Signed)
Wonderful thanks!

## 2020-02-16 ENCOUNTER — Other Ambulatory Visit: Payer: Self-pay | Admitting: Physician Assistant

## 2020-02-16 ENCOUNTER — Ambulatory Visit: Payer: Medicare Other | Admitting: Neurology

## 2020-02-16 DIAGNOSIS — E2839 Other primary ovarian failure: Secondary | ICD-10-CM

## 2020-02-17 ENCOUNTER — Ambulatory Visit
Admission: RE | Admit: 2020-02-17 | Discharge: 2020-02-17 | Disposition: A | Discharge status: Home / Self Care | Payer: Medicare Other | Source: Ambulatory Visit | Attending: Physician Assistant | Admitting: Physician Assistant

## 2020-02-17 ENCOUNTER — Other Ambulatory Visit: Payer: Self-pay

## 2020-02-17 DIAGNOSIS — E2839 Other primary ovarian failure: Secondary | ICD-10-CM

## 2020-02-21 ENCOUNTER — Other Ambulatory Visit: Payer: Self-pay

## 2020-02-21 ENCOUNTER — Ambulatory Visit
Admission: RE | Admit: 2020-02-21 | Discharge: 2020-02-21 | Disposition: A | Discharge status: Home / Self Care | Payer: Medicare Other | Source: Ambulatory Visit | Attending: Neurology | Admitting: Neurology

## 2020-02-21 DIAGNOSIS — G441 Vascular headache, not elsewhere classified: Secondary | ICD-10-CM

## 2020-02-21 DIAGNOSIS — R519 Headache, unspecified: Secondary | ICD-10-CM

## 2020-02-21 DIAGNOSIS — R51 Headache with orthostatic component, not elsewhere classified: Secondary | ICD-10-CM

## 2020-02-21 DIAGNOSIS — H5711 Ocular pain, right eye: Secondary | ICD-10-CM

## 2020-02-21 DIAGNOSIS — H539 Unspecified visual disturbance: Secondary | ICD-10-CM

## 2020-02-21 MED ORDER — GADOBENATE DIMEGLUMINE 529 MG/ML IV SOLN
18.0000 mL | Freq: Once | INTRAVENOUS | Status: AC | PRN
  Administered 2020-02-21: 11:00:00 18 mL via INTRAVENOUS

## 2020-02-24 ENCOUNTER — Ambulatory Visit: Payer: Medicare Other | Admitting: Cardiology

## 2020-03-09 ENCOUNTER — Telehealth: Payer: Self-pay | Admitting: Neurology

## 2020-03-09 NOTE — Telephone Encounter (Signed)
Pt called wanting to know if her MRI results have come in. Please advise. 

## 2020-03-12 NOTE — Telephone Encounter (Signed)
I spoke with the patient and let her know the MRI brain was sent to her mychart on 02/22/20. Pt had not seen it. I let her know since she has an active account on file our office does send results to it. Pt was advised of the MRI brain results. She verbalized appreciation. She will f/u with Amy NP in June. She verbalized appreciation for the call.

## 2020-03-15 ENCOUNTER — Telehealth: Payer: Self-pay | Admitting: Radiology

## 2020-03-15 ENCOUNTER — Ambulatory Visit: Payer: Medicare Other | Admitting: Internal Medicine

## 2020-03-15 ENCOUNTER — Other Ambulatory Visit: Payer: Self-pay

## 2020-03-15 ENCOUNTER — Encounter: Payer: Self-pay | Admitting: Internal Medicine

## 2020-03-15 VITALS — BP 122/84 | HR 107 | Ht 64.0 in | Wt 198.6 lb

## 2020-03-15 DIAGNOSIS — R002 Palpitations: Secondary | ICD-10-CM | POA: Diagnosis not present

## 2020-03-15 NOTE — Progress Notes (Signed)
Cardiology Office Note   Date:  03/17/2020   ID:  Kiara Wilson, Kiara Wilson 09-13-1961, MRN [id number]  PCP:  Carol Ada, MD  Cardiologist:   Dorris Carnes, MD   Pt presents for evaluation of heart racing      History of Present Illness: Kiara Wilson is a 59 y.o. female with a history of asthma, fibromyalgia, headaches, palpitations and tachycardia   She originally was seen in 2011 by a cardiologist in [place].  BP noted to be labile  Verapamill added and ? toprol d/c'd.   Holter reported to show frequent PACs.  Verapmil increased to 180 mg per day   Pt also felt to have panic spells which were exacerbating spells as well  Klonopin added.  Cymbalta added for fibromyalgia.    She is follower in [place] by [employer] Lacey Jensen PA)  She was seen in Feb    Now on losartan.  The dose ws increased to improve BP control  The pt says over past 10 years periods of skips come and go.   HR in 90s to 100s when she takes it   This bothers her  Worries about heart   She also says she feels tired all the time Denies CP   Notes some dizziness but no syncope   She does not drink caffeine drinks    Current Meds  Medication Sig  . APPLE CIDER VINEGAR PO Take 2 each by mouth daily.  . Ascorbic Acid (VITAMIN C) 1000 MG tablet Take 1,000 mg by mouth daily.  . ASHWAGANDHA PO Take by mouth daily.  . cetirizine (ZYRTEC) 10 MG tablet Take 10 mg by mouth daily.  . Cholecalciferol (VITAMIN D3 PO) Take 5,000 Int'l Units by mouth daily.  . clonazePAM (KLONOPIN) 1 MG tablet Take 1 mg by mouth 2 (two) times daily as needed.    Marland Kitchen CLOTRIMAZOLE-BETAMETH & ZN OX EX Apply topically.  . Coenzyme Q10 (CO Q 10 PO) Take by mouth daily.  . Cyanocobalamin (VITAMIN B-12 PO) Take 500 mcg by mouth daily.  . DULoxetine (CYMBALTA) 60 MG capsule Take 60 mg by mouth daily.  Marland Kitchen losartan (COZAAR) 50 MG tablet Take 50 mg by mouth daily.    . meloxicam (MOBIC) 15 MG tablet Take 15 mg by mouth as needed.  . Omega-3 1000 MG  CAPS Take by mouth daily.  . Probiotic Product (PROBIOTIC PO) Take by mouth daily.  . SELENIUM PO Take 200 mcg by mouth daily.  . traMADol (ULTRAM) 50 MG tablet as needed.  . vitamin E (VITAMIN E) 180 MG (400 UNITS) capsule Take 400 Units by mouth daily.     Allergies:   Amoxicillin and Adhesive [tape]   Past Medical History:  Diagnosis Date  . Allergy   . Anxiety   . Arthritis   . Breast cancer ([facility]) 09/2007  . Depression   . Depression with anxiety 01/08/2020  . Essential hypertension 01/08/2020  . Fibromyalgia   . GERD (gastroesophageal reflux disease)   . Hypertension   . Migraine 01/08/2020  . Migraines   . Overactive bladder   . Personal history of radiation therapy     Past Surgical History:  Procedure Laterality Date  . BREAST BIOPSY Left    benign  . BREAST LUMPECTOMY Right 2008  . EYE SURGERY    . PELVIC LAPAROSCOPY  1992     Social History:  The patient  reports that she has never smoked. She has never used smokeless  tobacco. She reports that she does not drink alcohol or use drugs.   Family History:  The patient's family history includes Depression in her mother; Diabetes in her maternal grandmother and paternal grandmother; Fibromyalgia in her mother; High blood pressure in her father; Migraines in her mother.    ROS:  Please see the history of present illness. All other systems are reviewed and  Negative to the above problem except as noted.    PHYSICAL EXAM: VS:  BP 122/84   Pulse (!) 107   Ht 5\' 4"  (1.626 m)   Wt 198 lb 9.6 oz (90.1 kg)   LMP 08/08/2012   BMI 34.09 kg/m    Orthostatics:  Laying:   119/86  P 93   Sitting:   128/91  P100  Standing 2 minutes:   119/85  P 107   4 min   125/90  P 107     GEN: Well nourished, well developed, in no acute distress  HEENT: normal  Neck: no JVD, carotid bruits, or masses Cardiac: RRR; no murmurs, rubs, or gallops,no edema  Respiratory:  clear to auscultation bilaterally,  GI: soft, nontender,  nondistended, + BS  No hepatomegaly  MS: no deformity Moving all extremities   Skin: warm and dry, no rash Neuro:  Strength and sensation are intact Psych: euthymic mood, full affect   EKG:  EKG is ordered today.  Sinus tachycardai 107 bpm     Lipid Panel No results found for: CHOL, TRIG, HDL, CHOLHDL, VLDL, LDLCALC, LDLDIRECT    Wt Readings from Last 3 Encounters:  03/15/20 198 lb 9.6 oz (90.1 kg)  01/25/20 195 lb (88.5 kg)  01/10/20 196 lb (88.9 kg)      ASSESSMENT AND PLAN:  1  Palpitations   Spells are bothersome but I am not convinced they are hemodynamically destabiling and I do not think they represent signif/severe arrhythmia    On exam today her heart rates with standing are fast but she does not meet criteria for orthostatic hypotension/POS I would recomm Zio monitor to look at ectopy burden and overall heart rate control Would also check UA today to see SG, assess hydratoin   Stay hydrated   2  HTN  Diastolic is a little high on current regimen   She may actually from low dose b blocker  Follow for now She does complain of an intermitt cough    I am not convinced it is related to losartan    Would follow for now    3  HCM  Lpiids in Feb :   LDL 80  HDL 40  Good controll    Current medicines are reviewed at length with the patient today.  The patient does not have concerns regarding medicines.  Signed, Dorris Carnes, MD  03/17/2020 10:14 PM    [address], [place], [place]  [address] Phone: [phone]; Fax: [phone]

## 2020-03-15 NOTE — Patient Instructions (Signed)
Medication Instructions:  No changes *If you need a refill on your cardiac medications before your next appointment, please call your pharmacy*  Lab Work: Urinalysis today  If you have labs (blood work) drawn today and your tests are completely normal, you will receive your results only by: Marland Kitchen MyChart Message (if you have MyChart) OR . A paper copy in the mail If you have any lab test that is abnormal or we need to change your treatment, we will call you to review the results.   Testing/Procedures: Your physician has recommended that you wear a Zio heart monitor. Heart monitors are medical devices that record the heart's electrical activity. Doctors most often use these monitors to diagnose arrhythmias. Arrhythmias are problems with the speed or rhythm of the heartbeat. The monitor is a small, portable device. You can wear one while you do your normal daily activities. This is usually used to diagnose what is causing palpitations/syncope (passing out).     Follow-Up: Your physician recommends that you schedule a follow-up appointment as needed with Dr. Harrington Challenger.  Other Instructions

## 2020-03-15 NOTE — Telephone Encounter (Signed)
Enrolled patient for a 3 day Zio monitor to be mailed to patients home.  

## 2020-03-16 LAB — URINALYSIS
Bilirubin, UA: NEGATIVE
Glucose, UA: NEGATIVE
Leukocytes,UA: NEGATIVE
Nitrite, UA: NEGATIVE
Protein,UA: NEGATIVE
RBC, UA: NEGATIVE
Specific Gravity, UA: 1.017 (ref 1.005–1.030)
Urobilinogen, Ur: 0.2 mg/dL (ref 0.2–1.0)
pH, UA: 6 (ref 5.0–7.5)

## 2020-03-19 ENCOUNTER — Ambulatory Visit (INDEPENDENT_AMBULATORY_CARE_PROVIDER_SITE_OTHER): Payer: Medicare Other

## 2020-03-19 DIAGNOSIS — R002 Palpitations: Secondary | ICD-10-CM

## 2020-03-26 ENCOUNTER — Other Ambulatory Visit: Payer: Self-pay | Admitting: Family Medicine

## 2020-03-26 DIAGNOSIS — M47816 Spondylosis without myelopathy or radiculopathy, lumbar region: Secondary | ICD-10-CM

## 2020-04-10 ENCOUNTER — Other Ambulatory Visit: Payer: Self-pay | Admitting: *Deleted

## 2020-04-10 ENCOUNTER — Telehealth: Payer: Self-pay | Admitting: Internal Medicine

## 2020-04-10 MED ORDER — METOPROLOL SUCCINATE ER 25 MG PO TB24: 25.0000 mg | ORAL_TABLET | Freq: Every day | ORAL | 3 refills | Status: DC

## 2020-04-10 NOTE — Telephone Encounter (Signed)
Follow Up:     Returning your call, concerning her results.

## 2020-04-17 ENCOUNTER — Other Ambulatory Visit: Payer: Medicare Other

## 2020-04-19 ENCOUNTER — Other Ambulatory Visit: Payer: Medicare Other

## 2020-04-26 NOTE — Telephone Encounter (Signed)
See monitor results ./cy

## 2020-05-24 ENCOUNTER — Ambulatory Visit: Payer: Medicare Other | Admitting: Family Medicine

## 2020-06-21 NOTE — Progress Notes (Deleted)
Cardiology Office Note   Date:  06/21/2020   ID:  Kiara, Wilson 26-Apr-1961, MRN [id number]  PCP:  Kiara Ada, MD  Cardiologist:   Kiara Carnes, MD   Pt presents for evaluation of heart racing      History of Present Illness: Kiara Wilson is a 59 y.o. female with a history of asthma, fibromyalgia, headaches, palpitations and tachycardia   She originally was seen in 2011 by a cardiologist in [place].  BP noted to be labile  Verapamill added and ? toprol d/c'd.   Holter reported to show frequent PACs.  Verapmil increased to 180 mg per day   Pt also felt to have panic spells which were exacerbating spells as well  Klonopin added.  Cymbalta added for fibromyalgia.    She is follower in [place] by [employer] Kiara Jensen PA)  She was seen in Feb    Now on losartan.  The dose ws increased to improve BP control  The pt says over past 10 years periods of skips come and go.   HR in 90s to 100s when she takes it   This bothers her  Worries about heart   She also says she feels tired all the time Denies CP   Notes some dizziness but no syncope   She does not drink caffeine drinks    No outpatient medications have been marked as taking for the 06/22/20 encounter (Appointment) with Kiara Records, MD.     Allergies:   Amoxicillin and Adhesive [tape]   Past Medical History:  Diagnosis Date  . Allergy   . Anxiety   . Arthritis   . Breast cancer ([place]) 09/2007  . Depression   . Depression with anxiety 01/08/2020  . Essential hypertension 01/08/2020  . Fibromyalgia   . GERD (gastroesophageal reflux disease)   . Hypertension   . Migraine 01/08/2020  . Migraines   . Overactive bladder   . Personal history of radiation therapy     Past Surgical History:  Procedure Laterality Date  . BREAST BIOPSY Left    benign  . BREAST LUMPECTOMY Right 2008  . EYE SURGERY    . PELVIC LAPAROSCOPY  1992     Social History:  The patient  reports that she has never smoked. She has never  used smokeless tobacco. She reports that she does not drink alcohol and does not use drugs.   Family History:  The patient's family history includes Depression in her mother; Diabetes in her maternal grandmother and paternal grandmother; Fibromyalgia in her mother; High blood pressure in her father; Migraines in her mother.    ROS:  Please see the history of present illness. All other systems are reviewed and  Negative to the above problem except as noted.    PHYSICAL EXAM: VS:  LMP 08/08/2012    Orthostatics:  Laying:   119/86  P 93   Sitting:   128/91  P100  Standing 2 minutes:   119/85  P 107   4 min   125/90  P 107     GEN: Well nourished, well developed, in no acute distress  HEENT: normal  Neck: no JVD, carotid bruits, or masses Cardiac: RRR; no murmurs, rubs, or gallops,no edema  Respiratory:  clear to auscultation bilaterally,  GI: soft, nontender, nondistended, + BS  No hepatomegaly  MS: no deformity Moving all extremities   Skin: warm and dry, no rash Neuro:  Strength and sensation are intact Psych: euthymic  mood, full affect   EKG:  EKG is ordered today.  Sinus tachycardai 107 bpm     Lipid Panel No results found for: CHOL, TRIG, HDL, CHOLHDL, VLDL, LDLCALC, LDLDIRECT    Wt Readings from Last 3 Encounters:  03/15/20 198 lb 9.6 oz (90.1 kg)  01/25/20 195 lb (88.5 kg)  01/10/20 196 lb (88.9 kg)      ASSESSMENT AND PLAN:  1  Palpitations   Spells are bothersome but I am not convinced they are hemodynamically destabiling and I do not think they represent signif/severe arrhythmia    On exam today her heart rates with standing are fast but she does not meet criteria for orthostatic hypotension/POS I would recomm Zio monitor to look at ectopy burden and overall heart rate control Would also check UA today to see SG, assess hydratoin   Stay hydrated   2  HTN  Diastolic is a little high on current regimen   She may actually from low dose b blocker  Follow for  now She does complain of an intermitt cough    I am not convinced it is related to losartan    Would follow for now    3  HCM  Lpiids in Feb :   LDL 80  HDL 40  Good controll    Current medicines are reviewed at length with the patient today.  The patient does not have concerns regarding medicines.  Signed, Kiara Carnes, MD  06/21/2020 9:49 PM    [facility] [address], [place], [place]  [address] Phone: [phone]; Fax: [phone]

## 2020-06-22 ENCOUNTER — Ambulatory Visit: Payer: Medicare Other | Admitting: Internal Medicine

## 2020-06-27 ENCOUNTER — Telehealth: Payer: Self-pay | Admitting: Internal Medicine

## 2020-06-27 NOTE — Telephone Encounter (Signed)
Pt c/o medication issue:  1. Name of Medication: metoprolol succinate (TOPROL-XL) 25 MG 24 hr tablet  2. How are you currently taking this medication (dosage and times per day)? As directed  3. Are you having a reaction (difficulty breathing--STAT)?   4. What is your medication issue? Pt is still having some PVC. She wanted to know if Dr. Harrington Challenger wanted to keep her on the same dosage of this medication

## 2020-06-27 NOTE — Telephone Encounter (Signed)
Agree with recommendations by B Pamala Duffel. Continue meds   If symptoms increase can increase Toprol XL to bid

## 2020-06-27 NOTE — Telephone Encounter (Signed)
Called and spoke to patient. She states that she was started on Toprol-XL 25 mg QD due to PVCs/PACs seen on the monitor. She states that her Sx have gotten a lot better. She states that she has these episodes of irregular beats about every 2 weeks or so. Denies any additional complaints. She states that her BP has been 110-120/80s and HR has been 80-99 bpm. She states that she was suppose to follow up with Dr. Harrington Challenger on 7/2, but she forgot about the appointment. Patient wanting to know if she should continue her medicine and if she needs to increase it. Instructed patient to continue current dose and let us know if her Sx become more frequent. Made her aware that I will forward to Dr. Harrington Challenger for review. She verbalized understanding.

## 2020-07-04 NOTE — Telephone Encounter (Signed)
Patient calling back to follow up.

## 2020-07-05 MED ORDER — METOPROLOL SUCCINATE ER 25 MG PO TB24: 25.0000 mg | ORAL_TABLET | Freq: Every day | ORAL | 3 refills | Status: DC

## 2020-07-05 NOTE — Telephone Encounter (Signed)
I called patient. Adv that Dr. Harrington Challenger is in agreement with plan to continue Toprol XL 25 mg and she will contact our office if symptoms from PACs/PVCs worsen and at that time we will increase Toprol XL 25 mg to twice a day as recommended below by Dr. Harrington Challenger.   Refill sent to [facility] per patient request.

## 2020-07-20 ENCOUNTER — Other Ambulatory Visit: Payer: Self-pay

## 2020-07-20 ENCOUNTER — Ambulatory Visit
Admission: RE | Admit: 2020-07-20 | Discharge: 2020-07-20 | Disposition: A | Discharge status: Home / Self Care | Payer: Medicare Other | Source: Ambulatory Visit | Attending: Family Medicine | Admitting: Family Medicine

## 2020-07-20 DIAGNOSIS — Z1231 Encounter for screening mammogram for malignant neoplasm of breast: Secondary | ICD-10-CM

## 2020-08-05 ENCOUNTER — Other Ambulatory Visit: Payer: Self-pay | Admitting: Internal Medicine

## 2020-10-22 ENCOUNTER — Ambulatory Visit (INDEPENDENT_AMBULATORY_CARE_PROVIDER_SITE_OTHER): Payer: Medicare Other

## 2020-10-22 ENCOUNTER — Other Ambulatory Visit: Payer: Self-pay

## 2020-10-22 ENCOUNTER — Ambulatory Visit
Admission: EM | Admit: 2020-10-22 | Discharge: 2020-10-22 | Disposition: A | Discharge status: Home / Self Care | Payer: Medicare Other

## 2020-10-22 ENCOUNTER — Encounter (HOSPITAL_COMMUNITY): Payer: Self-pay | Admitting: *Deleted

## 2020-10-22 ENCOUNTER — Ambulatory Visit (HOSPITAL_COMMUNITY)
Admission: EM | Admit: 2020-10-22 | Discharge: 2020-10-22 | Disposition: A | Discharge status: Home / Self Care | Payer: Medicare Other | Attending: Urgent Care | Admitting: Urgent Care

## 2020-10-22 DIAGNOSIS — M25532 Pain in left wrist: Secondary | ICD-10-CM

## 2020-10-22 DIAGNOSIS — M25522 Pain in left elbow: Secondary | ICD-10-CM | POA: Diagnosis not present

## 2020-10-22 DIAGNOSIS — S42402A Unspecified fracture of lower end of left humerus, initial encounter for closed fracture: Secondary | ICD-10-CM

## 2020-10-22 DIAGNOSIS — S62102A Fracture of unspecified carpal bone, left wrist, initial encounter for closed fracture: Secondary | ICD-10-CM

## 2020-10-22 DIAGNOSIS — W19XXXA Unspecified fall, initial encounter: Secondary | ICD-10-CM

## 2020-10-22 MED ORDER — HYDROCODONE-ACETAMINOPHEN 5-325 MG PO TABS
1.0000 | ORAL_TABLET | Freq: Four times a day (QID) | ORAL | 0 refills | Status: DC | PRN
Start: 2020-10-22 — End: 2022-02-06

## 2020-10-22 MED ORDER — HYDROCODONE-ACETAMINOPHEN 5-325 MG PO TABS: 1.0000 | ORAL_TABLET | Freq: Once | ORAL | Status: DC

## 2020-10-22 MED ORDER — NAPROXEN 500 MG PO TABS: 500.0000 mg | ORAL_TABLET | Freq: Two times a day (BID) | ORAL | 0 refills | Status: DC

## 2020-10-22 MED ORDER — HYDROCODONE-ACETAMINOPHEN 5-325 MG PO TABS
ORAL_TABLET | ORAL | Status: AC
  Filled 2020-10-22: qty 1

## 2020-10-22 MED ORDER — ACETAMINOPHEN 325 MG PO TABS
650.0000 mg | ORAL_TABLET | Freq: Once | ORAL | Status: AC
  Administered 2020-10-22: 650 mg via ORAL

## 2020-10-22 MED ORDER — ACETAMINOPHEN 325 MG PO TABS
ORAL_TABLET | ORAL | Status: AC
  Filled 2020-10-22: qty 2

## 2020-10-22 NOTE — Discharge Instructions (Signed)
The x-ray showed that you have both an elbow fracture and a wrist fracture.  Please make sure that you wear the arm sling and splint at all times until you are seen by the orthopedist, Dr. Jeannie Fend.  Call them today to make an appointment. Please schedule naproxen twice daily with food for your severe pain.  If you still have pain despite taking naproxen regularly, this is breakthrough pain.  You can use hydrocodone, a narcotic pain medicine, once every 4-6 hours for this.  Once your pain is better controlled, switch back to just naproxen.
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
## 2020-10-22 NOTE — ED Provider Notes (Signed)
[place]   MRN: [id number] DOB: [birth year]  Subjective:   Kiara Wilson is a 59 y.o. female presenting for suffering of fall yesterday.  Patient was playing with a rolling cart when it slipped on her foot and she fell backward landing primarily on her left wrist and elbow and back.  Today, her primary pain is in her wrist and elbow.  Reports no range of motion for the wrist, limited for the elbow.  Has a lot of movement pain.  Denies loss of consciousness, confusion, headache, vision change, weakness.  She used naproxen last night with little relief.  Has not taken anything for pain today.   Current Facility-Administered Medications:  .  acetaminophen (TYLENOL) tablet 650 mg, 650 mg, Oral, Once, Jaynee Eagles, PA-C  Current Outpatient Medications:  .  APPLE CIDER VINEGAR PO, Take 2 each by mouth daily., Disp: , Rfl:  .  cetirizine (ZYRTEC) 10 MG tablet, Take 10 mg by mouth daily., Disp: , Rfl:  .  Cholecalciferol (VITAMIN D3 PO), Take 5,000 Int'l Units by mouth daily., Disp: , Rfl:  .  clonazePAM (KLONOPIN) 1 MG tablet, Take 1 mg by mouth 2 (two) times daily as needed.  , Disp: , Rfl:  .  Cyanocobalamin (VITAMIN B-12 PO), Take 500 mcg by mouth daily., Disp: , Rfl:  .  DULoxetine (CYMBALTA) 60 MG capsule, Take 60 mg by mouth daily., Disp: , Rfl:  .  losartan (COZAAR) 50 MG tablet, Take 50 mg by mouth daily.  , Disp: , Rfl:  .  metoprolol succinate (TOPROL-XL) 25 MG 24 hr tablet, TAKE 1 TABLET BY MOUTH EVERY DAY, Disp: 90 tablet, Rfl: 2 .  Omega-3 1000 MG CAPS, Take by mouth daily., Disp: , Rfl:  .  SELENIUM PO, Take 200 mcg by mouth daily., Disp: , Rfl:  .  traMADol (ULTRAM) 50 MG tablet, as needed., Disp: , Rfl:  .  vitamin E (VITAMIN E) 180 MG (400 UNITS) capsule, Take 400 Units by mouth daily., Disp: , Rfl:  .  Ascorbic Acid (VITAMIN C) 1000 MG tablet, Take 1,000 mg by mouth daily., Disp: , Rfl:  .  ASHWAGANDHA PO, Take by mouth daily., Disp: , Rfl:  .   CLOTRIMAZOLE-BETAMETH & ZN OX EX, Apply topically., Disp: , Rfl:  .  Coenzyme Q10 (CO Q 10 PO), Take by mouth daily., Disp: , Rfl:  .  meloxicam (MOBIC) 15 MG tablet, Take 15 mg by mouth as needed., Disp: , Rfl:  .  Probiotic Product (PROBIOTIC PO), Take by mouth daily., Disp: , Rfl:    Allergies  Allergen Reactions  . Amoxicillin   . Adhesive [Tape] Rash    Past Medical History:  Diagnosis Date  . Allergy   . Anxiety   . Arthritis   . Breast cancer ([place]) 09/2007  . Depression   . Depression with anxiety 01/08/2020  . Essential hypertension 01/08/2020  . Fibromyalgia   . GERD (gastroesophageal reflux disease)   . Hypertension   . Migraine 01/08/2020  . Migraines   . Overactive bladder   . Personal history of radiation therapy      Past Surgical History:  Procedure Laterality Date  . BREAST BIOPSY Left    benign  . BREAST LUMPECTOMY Right 2008  . EYE SURGERY    . PELVIC LAPAROSCOPY  1992    Family History  Problem Relation Age of Onset  . Migraines Mother   . Depression Mother   . Fibromyalgia Mother   .  High blood pressure Father   . Diabetes Maternal Grandmother   . Diabetes Paternal Grandmother     Social History   Tobacco Use  . Smoking status: Never Smoker  . Smokeless tobacco: Never Used  Vaping Use  . Vaping Use: Never used  Substance Use Topics  . Alcohol use: No    Comment: rare  . Drug use: No    ROS   Objective:   Vitals: BP 127/88 (BP Location: Right Arm)   Pulse (!) 112   Temp 98.4 F (36.9 C) (Oral)   Resp 18   Ht 5\' 4"  (1.626 m)   Wt 187 lb (84.8 kg)   LMP 08/08/2012   SpO2 100%   BMI 32.10 kg/m   Physical Exam Constitutional:      General: She is not in acute distress.    Appearance: Normal appearance. She is well-developed. She is not ill-appearing, toxic-appearing or diaphoretic.  HENT:     Head: Normocephalic and atraumatic.     Right Ear: External ear normal.     Left Ear: External ear normal.     Nose: Nose  normal.     Mouth/Throat:     Mouth: Mucous membranes are moist.     Pharynx: Oropharynx is clear.  Eyes:     General: No scleral icterus.       Right eye: No discharge.        Left eye: No discharge.     Extraocular Movements: Extraocular movements intact.     Conjunctiva/sclera: Conjunctivae normal.     Pupils: Pupils are equal, round, and reactive to light.  Cardiovascular:     Rate and Rhythm: Normal rate.  Pulmonary:     Effort: Pulmonary effort is normal.  Musculoskeletal:     Left elbow: No swelling, deformity, effusion or lacerations. Decreased range of motion. Tenderness present in radial head, lateral epicondyle and olecranon process. No medial epicondyle tenderness.     Left forearm: No swelling, edema, deformity, lacerations, tenderness or bony tenderness.     Left wrist: Swelling, tenderness and bony tenderness present. No deformity, effusion, lacerations, snuff box tenderness or crepitus. Decreased range of motion.     Left hand: No swelling, deformity, lacerations, tenderness or bony tenderness. Normal range of motion. Normal strength. Normal sensation. Normal capillary refill.     Comments: Patient is neurovascularly intact with 2+ radial pulse of the left side, brisk capillary refill, appropriate skin temperature.  No ecchymosis.  Skin:    General: Skin is warm and dry.  Neurological:     General: No focal deficit present.     Mental Status: She is alert and oriented to person, place, and time.     Cranial Nerves: No cranial nerve deficit.     Motor: No weakness.     Coordination: Coordination normal.     Gait: Gait normal.     Deep Tendon Reflexes: Reflexes normal.  Psychiatric:        Mood and Affect: Mood normal.        Behavior: Behavior normal.        Thought Content: Thought content normal.        Judgment: Judgment normal.    DG Elbow Complete Left  Result Date: 10/22/2020 CLINICAL DATA:  Acute left elbow pain after fall yesterday. EXAM: LEFT ELBOW -  COMPLETE 3+ VIEW COMPARISON:  None. FINDINGS: Abnormal anterior fat pad displacement is noted suggesting underlying joint effusion. Minimally displaced fracture is seen involving the coronoid process of  the proximal ulna. No other definite bony abnormality is noted. IMPRESSION: Minimally displaced fracture involving the coronoid process of the proximal ulna. Joint effusion is noted. Electronically Signed   By: Marijo Conception M.D.   On: 10/22/2020 09:28   DG Wrist Complete Left  Result Date: 10/22/2020 CLINICAL DATA:  Acute left wrist pain after fall yesterday. EXAM: LEFT WRIST - COMPLETE 3+ VIEW COMPARISON:  None. FINDINGS: Moderately displaced and comminuted fracture is seen involving the distal left radius with intra-articular extension. Soft tissues are unremarkable. IMPRESSION: Moderately displaced and comminuted distal left radial fracture with intra-articular extension. Electronically Signed   By: Marijo Conception M.D.   On: 10/22/2020 09:26    Assessment and Plan :   I have reviewed the PDMP during this encounter.  1. Left wrist pain   2. Left elbow pain   3. Fall, initial encounter     Per ortho PA Jacqulynn Cadet on call, patient is to be placed in sugar tong splint, arm sling. Follow up with Dr. Jeannie Fend. Schedule naproxen, hydrocodone for breakthrough pain. Counseled patient on potential for adverse effects with medications prescribed/recommended today, ER and return-to-clinic precautions discussed, patient verbalized understanding.     Jaynee Eagles, [place] 10/22/20 [phone]

## 2020-10-22 NOTE — Progress Notes (Signed)
Orthopedic Tech Progress Note Patient Details:  Kiara Wilson [birth year] [id number]  Ortho Devices Type of Ortho Device: Sugartong splint, Arm sling Ortho Device/Splint Location: LUE Ortho Device/Splint Interventions: Ordered, Application, Adjustment   Post Interventions Patient Tolerated: Well Instructions Provided: Care of device   Janit Pagan 10/22/2020, 10:52 AM

## 2020-10-23 ENCOUNTER — Telehealth (HOSPITAL_COMMUNITY): Payer: Self-pay | Admitting: Emergency Medicine

## 2020-10-23 ENCOUNTER — Telehealth (HOSPITAL_COMMUNITY): Payer: Self-pay | Admitting: Urgent Care

## 2020-10-23 NOTE — Telephone Encounter (Signed)
Patient is actually on methotrexate.  She called to inform us about this and was advised about take naproxen at the pharmacy together with this medication.  Recommended using the opioid pain medication for pain control of her wrist fracture.

## 2020-10-25 DIAGNOSIS — S52502A Unspecified fracture of the lower end of left radius, initial encounter for closed fracture: Secondary | ICD-10-CM | POA: Insufficient documentation

## 2020-10-25 DIAGNOSIS — S52009A Unspecified fracture of upper end of unspecified ulna, initial encounter for closed fracture: Secondary | ICD-10-CM | POA: Insufficient documentation

## 2020-12-27 DIAGNOSIS — S52532A Colles' fracture of left radius, initial encounter for closed fracture: Secondary | ICD-10-CM | POA: Diagnosis not present

## 2020-12-27 DIAGNOSIS — M25532 Pain in left wrist: Secondary | ICD-10-CM | POA: Diagnosis not present

## 2021-01-04 DIAGNOSIS — M25532 Pain in left wrist: Secondary | ICD-10-CM | POA: Diagnosis not present

## 2021-01-09 DIAGNOSIS — M2550 Pain in unspecified joint: Secondary | ICD-10-CM | POA: Diagnosis not present

## 2021-01-09 DIAGNOSIS — J45909 Unspecified asthma, uncomplicated: Secondary | ICD-10-CM | POA: Diagnosis not present

## 2021-01-09 DIAGNOSIS — M064 Inflammatory polyarthropathy: Secondary | ICD-10-CM | POA: Diagnosis not present

## 2021-01-09 DIAGNOSIS — I10 Essential (primary) hypertension: Secondary | ICD-10-CM | POA: Diagnosis not present

## 2021-01-09 DIAGNOSIS — M353 Polymyalgia rheumatica: Secondary | ICD-10-CM | POA: Diagnosis not present

## 2021-01-09 DIAGNOSIS — M791 Myalgia, unspecified site: Secondary | ICD-10-CM | POA: Diagnosis not present

## 2021-01-09 DIAGNOSIS — M797 Fibromyalgia: Secondary | ICD-10-CM | POA: Diagnosis not present

## 2021-01-09 DIAGNOSIS — M1990 Unspecified osteoarthritis, unspecified site: Secondary | ICD-10-CM | POA: Diagnosis not present

## 2021-01-09 DIAGNOSIS — R7309 Other abnormal glucose: Secondary | ICD-10-CM | POA: Diagnosis not present

## 2021-01-11 DIAGNOSIS — M25532 Pain in left wrist: Secondary | ICD-10-CM | POA: Diagnosis not present

## 2021-01-17 DIAGNOSIS — M25532 Pain in left wrist: Secondary | ICD-10-CM | POA: Diagnosis not present

## 2021-01-22 DIAGNOSIS — M25532 Pain in left wrist: Secondary | ICD-10-CM | POA: Diagnosis not present

## 2021-01-24 DIAGNOSIS — S52532A Colles' fracture of left radius, initial encounter for closed fracture: Secondary | ICD-10-CM | POA: Diagnosis not present

## 2021-01-24 DIAGNOSIS — M25522 Pain in left elbow: Secondary | ICD-10-CM | POA: Diagnosis not present

## 2021-01-29 DIAGNOSIS — M25532 Pain in left wrist: Secondary | ICD-10-CM | POA: Diagnosis not present

## 2021-02-14 DIAGNOSIS — G894 Chronic pain syndrome: Secondary | ICD-10-CM | POA: Diagnosis not present

## 2021-02-14 DIAGNOSIS — I10 Essential (primary) hypertension: Secondary | ICD-10-CM | POA: Diagnosis not present

## 2021-02-14 DIAGNOSIS — K219 Gastro-esophageal reflux disease without esophagitis: Secondary | ICD-10-CM | POA: Diagnosis not present

## 2021-02-14 DIAGNOSIS — J45909 Unspecified asthma, uncomplicated: Secondary | ICD-10-CM | POA: Diagnosis not present

## 2021-02-14 DIAGNOSIS — J309 Allergic rhinitis, unspecified: Secondary | ICD-10-CM | POA: Diagnosis not present

## 2021-02-14 DIAGNOSIS — E559 Vitamin D deficiency, unspecified: Secondary | ICD-10-CM | POA: Diagnosis not present

## 2021-02-14 DIAGNOSIS — R7303 Prediabetes: Secondary | ICD-10-CM | POA: Diagnosis not present

## 2021-02-14 DIAGNOSIS — Z1389 Encounter for screening for other disorder: Secondary | ICD-10-CM | POA: Diagnosis not present

## 2021-02-14 DIAGNOSIS — Z0000 Encounter for general adult medical examination without abnormal findings: Secondary | ICD-10-CM | POA: Diagnosis not present

## 2021-02-14 DIAGNOSIS — M797 Fibromyalgia: Secondary | ICD-10-CM | POA: Diagnosis not present

## 2021-03-01 DIAGNOSIS — M25532 Pain in left wrist: Secondary | ICD-10-CM | POA: Diagnosis not present

## 2021-04-10 DIAGNOSIS — M353 Polymyalgia rheumatica: Secondary | ICD-10-CM | POA: Diagnosis not present

## 2021-04-10 DIAGNOSIS — M25559 Pain in unspecified hip: Secondary | ICD-10-CM | POA: Diagnosis not present

## 2021-04-10 DIAGNOSIS — M25512 Pain in left shoulder: Secondary | ICD-10-CM | POA: Diagnosis not present

## 2021-04-10 DIAGNOSIS — M8568 Other cyst of bone, other site: Secondary | ICD-10-CM | POA: Diagnosis not present

## 2021-04-10 DIAGNOSIS — R7309 Other abnormal glucose: Secondary | ICD-10-CM | POA: Diagnosis not present

## 2021-04-10 DIAGNOSIS — M47816 Spondylosis without myelopathy or radiculopathy, lumbar region: Secondary | ICD-10-CM | POA: Diagnosis not present

## 2021-04-10 DIAGNOSIS — M549 Dorsalgia, unspecified: Secondary | ICD-10-CM | POA: Diagnosis not present

## 2021-04-10 DIAGNOSIS — M160 Bilateral primary osteoarthritis of hip: Secondary | ICD-10-CM | POA: Diagnosis not present

## 2021-04-10 DIAGNOSIS — I10 Essential (primary) hypertension: Secondary | ICD-10-CM | POA: Diagnosis not present

## 2021-04-10 DIAGNOSIS — M25511 Pain in right shoulder: Secondary | ICD-10-CM | POA: Diagnosis not present

## 2021-04-10 DIAGNOSIS — M1990 Unspecified osteoarthritis, unspecified site: Secondary | ICD-10-CM | POA: Diagnosis not present

## 2021-04-10 DIAGNOSIS — J45909 Unspecified asthma, uncomplicated: Secondary | ICD-10-CM | POA: Diagnosis not present

## 2021-04-10 DIAGNOSIS — M2550 Pain in unspecified joint: Secondary | ICD-10-CM | POA: Diagnosis not present

## 2021-04-10 DIAGNOSIS — M25551 Pain in right hip: Secondary | ICD-10-CM | POA: Diagnosis not present

## 2021-04-10 DIAGNOSIS — M797 Fibromyalgia: Secondary | ICD-10-CM | POA: Diagnosis not present

## 2021-04-10 DIAGNOSIS — M064 Inflammatory polyarthropathy: Secondary | ICD-10-CM | POA: Diagnosis not present

## 2021-04-23 DIAGNOSIS — S52532A Colles' fracture of left radius, initial encounter for closed fracture: Secondary | ICD-10-CM | POA: Diagnosis not present

## 2021-05-01 DIAGNOSIS — G4700 Insomnia, unspecified: Secondary | ICD-10-CM | POA: Diagnosis not present

## 2021-05-01 DIAGNOSIS — Z853 Personal history of malignant neoplasm of breast: Secondary | ICD-10-CM | POA: Diagnosis not present

## 2021-05-01 DIAGNOSIS — J45909 Unspecified asthma, uncomplicated: Secondary | ICD-10-CM | POA: Diagnosis not present

## 2021-05-01 DIAGNOSIS — I10 Essential (primary) hypertension: Secondary | ICD-10-CM | POA: Diagnosis not present

## 2021-05-01 DIAGNOSIS — K219 Gastro-esophageal reflux disease without esophagitis: Secondary | ICD-10-CM | POA: Diagnosis not present

## 2021-05-01 DIAGNOSIS — G43009 Migraine without aura, not intractable, without status migrainosus: Secondary | ICD-10-CM | POA: Diagnosis not present

## 2021-05-01 DIAGNOSIS — C50919 Malignant neoplasm of unspecified site of unspecified female breast: Secondary | ICD-10-CM | POA: Diagnosis not present

## 2021-05-03 DIAGNOSIS — N6342 Unspecified lump in left breast, subareolar: Secondary | ICD-10-CM | POA: Diagnosis not present

## 2021-05-03 DIAGNOSIS — R42 Dizziness and giddiness: Secondary | ICD-10-CM | POA: Diagnosis not present

## 2021-05-07 ENCOUNTER — Other Ambulatory Visit: Payer: Self-pay | Admitting: Family Medicine

## 2021-05-07 DIAGNOSIS — H8110 Benign paroxysmal vertigo, unspecified ear: Secondary | ICD-10-CM | POA: Diagnosis not present

## 2021-05-07 DIAGNOSIS — N6342 Unspecified lump in left breast, subareolar: Secondary | ICD-10-CM

## 2021-05-13 ENCOUNTER — Other Ambulatory Visit: Payer: Self-pay | Admitting: Family Medicine

## 2021-05-13 DIAGNOSIS — Z1231 Encounter for screening mammogram for malignant neoplasm of breast: Secondary | ICD-10-CM

## 2021-05-15 ENCOUNTER — Other Ambulatory Visit: Payer: Self-pay

## 2021-05-15 ENCOUNTER — Ambulatory Visit
Admission: RE | Admit: 2021-05-15 | Discharge: 2021-05-15 | Disposition: A | Discharge status: Home / Self Care | Payer: Medicare Other | Source: Ambulatory Visit | Attending: Family Medicine | Admitting: Family Medicine

## 2021-05-15 DIAGNOSIS — R922 Inconclusive mammogram: Secondary | ICD-10-CM | POA: Diagnosis not present

## 2021-05-15 DIAGNOSIS — N6002 Solitary cyst of left breast: Secondary | ICD-10-CM | POA: Diagnosis not present

## 2021-05-15 DIAGNOSIS — N6342 Unspecified lump in left breast, subareolar: Secondary | ICD-10-CM

## 2021-06-03 DIAGNOSIS — K2289 Other specified disease of esophagus: Secondary | ICD-10-CM | POA: Diagnosis not present

## 2021-06-03 DIAGNOSIS — K219 Gastro-esophageal reflux disease without esophagitis: Secondary | ICD-10-CM | POA: Diagnosis not present

## 2021-06-03 DIAGNOSIS — Z1211 Encounter for screening for malignant neoplasm of colon: Secondary | ICD-10-CM | POA: Diagnosis not present

## 2021-06-03 DIAGNOSIS — R1310 Dysphagia, unspecified: Secondary | ICD-10-CM | POA: Diagnosis not present

## 2021-06-03 DIAGNOSIS — K5730 Diverticulosis of large intestine without perforation or abscess without bleeding: Secondary | ICD-10-CM | POA: Diagnosis not present

## 2021-06-10 ENCOUNTER — Other Ambulatory Visit: Payer: Medicare Other

## 2021-06-13 ENCOUNTER — Other Ambulatory Visit: Payer: Medicare Other

## 2021-06-18 DIAGNOSIS — K219 Gastro-esophageal reflux disease without esophagitis: Secondary | ICD-10-CM | POA: Diagnosis not present

## 2021-06-18 DIAGNOSIS — K5730 Diverticulosis of large intestine without perforation or abscess without bleeding: Secondary | ICD-10-CM | POA: Diagnosis not present

## 2021-06-18 DIAGNOSIS — R1310 Dysphagia, unspecified: Secondary | ICD-10-CM | POA: Diagnosis not present

## 2021-06-20 ENCOUNTER — Other Ambulatory Visit: Payer: Self-pay | Admitting: Gastroenterology

## 2021-06-20 DIAGNOSIS — R1310 Dysphagia, unspecified: Secondary | ICD-10-CM

## 2021-07-02 DIAGNOSIS — U071 COVID-19: Secondary | ICD-10-CM | POA: Diagnosis not present

## 2021-07-03 ENCOUNTER — Other Ambulatory Visit: Payer: Medicare Other

## 2021-07-22 ENCOUNTER — Other Ambulatory Visit: Payer: Self-pay

## 2021-07-22 ENCOUNTER — Ambulatory Visit
Admission: RE | Admit: 2021-07-22 | Discharge: 2021-07-22 | Disposition: A | Discharge status: Home / Self Care | Payer: Medicare Other | Source: Ambulatory Visit | Attending: Family Medicine | Admitting: Family Medicine

## 2021-07-22 DIAGNOSIS — Z1231 Encounter for screening mammogram for malignant neoplasm of breast: Secondary | ICD-10-CM

## 2021-08-14 DIAGNOSIS — E559 Vitamin D deficiency, unspecified: Secondary | ICD-10-CM | POA: Diagnosis not present

## 2021-08-14 DIAGNOSIS — J309 Allergic rhinitis, unspecified: Secondary | ICD-10-CM | POA: Diagnosis not present

## 2021-08-14 DIAGNOSIS — I10 Essential (primary) hypertension: Secondary | ICD-10-CM | POA: Diagnosis not present

## 2021-08-14 DIAGNOSIS — R0683 Snoring: Secondary | ICD-10-CM | POA: Diagnosis not present

## 2021-08-14 DIAGNOSIS — K219 Gastro-esophageal reflux disease without esophagitis: Secondary | ICD-10-CM | POA: Diagnosis not present

## 2021-08-14 DIAGNOSIS — Z853 Personal history of malignant neoplasm of breast: Secondary | ICD-10-CM | POA: Diagnosis not present

## 2021-08-14 DIAGNOSIS — G43909 Migraine, unspecified, not intractable, without status migrainosus: Secondary | ICD-10-CM | POA: Diagnosis not present

## 2021-08-14 DIAGNOSIS — G894 Chronic pain syndrome: Secondary | ICD-10-CM | POA: Diagnosis not present

## 2021-08-14 DIAGNOSIS — M797 Fibromyalgia: Secondary | ICD-10-CM | POA: Diagnosis not present

## 2021-08-14 DIAGNOSIS — R7303 Prediabetes: Secondary | ICD-10-CM | POA: Diagnosis not present

## 2021-09-03 DIAGNOSIS — G43009 Migraine without aura, not intractable, without status migrainosus: Secondary | ICD-10-CM | POA: Diagnosis not present

## 2021-09-03 DIAGNOSIS — J45909 Unspecified asthma, uncomplicated: Secondary | ICD-10-CM | POA: Diagnosis not present

## 2021-09-03 DIAGNOSIS — G4700 Insomnia, unspecified: Secondary | ICD-10-CM | POA: Diagnosis not present

## 2021-09-03 DIAGNOSIS — I10 Essential (primary) hypertension: Secondary | ICD-10-CM | POA: Diagnosis not present

## 2021-09-03 DIAGNOSIS — K219 Gastro-esophageal reflux disease without esophagitis: Secondary | ICD-10-CM | POA: Diagnosis not present

## 2021-09-24 ENCOUNTER — Ambulatory Visit
Admission: RE | Admit: 2021-09-24 | Discharge: 2021-09-24 | Disposition: A | Discharge status: Home / Self Care | Payer: Medicare Other | Source: Ambulatory Visit | Attending: Gastroenterology | Admitting: Gastroenterology

## 2021-09-24 DIAGNOSIS — R1310 Dysphagia, unspecified: Secondary | ICD-10-CM | POA: Diagnosis not present

## 2021-09-24 DIAGNOSIS — K219 Gastro-esophageal reflux disease without esophagitis: Secondary | ICD-10-CM | POA: Diagnosis not present

## 2021-11-12 DIAGNOSIS — H04123 Dry eye syndrome of bilateral lacrimal glands: Secondary | ICD-10-CM | POA: Diagnosis not present

## 2021-11-12 DIAGNOSIS — H2513 Age-related nuclear cataract, bilateral: Secondary | ICD-10-CM | POA: Diagnosis not present

## 2021-11-16 ENCOUNTER — Emergency Department (HOSPITAL_COMMUNITY): Payer: Medicare Other

## 2021-11-16 ENCOUNTER — Emergency Department (HOSPITAL_COMMUNITY)
Admission: EM | Admit: 2021-11-16 | Discharge: 2021-11-16 | Disposition: A | Discharge status: Home / Self Care | Payer: Medicare Other | Attending: Emergency Medicine | Admitting: Emergency Medicine

## 2021-11-16 ENCOUNTER — Other Ambulatory Visit: Payer: Self-pay

## 2021-11-16 ENCOUNTER — Encounter (HOSPITAL_COMMUNITY): Payer: Self-pay

## 2021-11-16 DIAGNOSIS — M7989 Other specified soft tissue disorders: Secondary | ICD-10-CM | POA: Diagnosis not present

## 2021-11-16 DIAGNOSIS — M545 Low back pain, unspecified: Secondary | ICD-10-CM | POA: Diagnosis not present

## 2021-11-16 DIAGNOSIS — R0789 Other chest pain: Secondary | ICD-10-CM | POA: Diagnosis not present

## 2021-11-16 DIAGNOSIS — Z853 Personal history of malignant neoplasm of breast: Secondary | ICD-10-CM | POA: Insufficient documentation

## 2021-11-16 DIAGNOSIS — M791 Myalgia, unspecified site: Secondary | ICD-10-CM | POA: Diagnosis not present

## 2021-11-16 DIAGNOSIS — M7918 Myalgia, other site: Secondary | ICD-10-CM

## 2021-11-16 DIAGNOSIS — I10 Essential (primary) hypertension: Secondary | ICD-10-CM | POA: Insufficient documentation

## 2021-11-16 DIAGNOSIS — M542 Cervicalgia: Secondary | ICD-10-CM | POA: Insufficient documentation

## 2021-11-16 DIAGNOSIS — Z79899 Other long term (current) drug therapy: Secondary | ICD-10-CM | POA: Diagnosis not present

## 2021-11-16 DIAGNOSIS — Y92410 Unspecified street and highway as the place of occurrence of the external cause: Secondary | ICD-10-CM | POA: Insufficient documentation

## 2021-11-16 DIAGNOSIS — S6992XA Unspecified injury of left wrist, hand and finger(s), initial encounter: Secondary | ICD-10-CM | POA: Diagnosis present

## 2021-11-16 DIAGNOSIS — R079 Chest pain, unspecified: Secondary | ICD-10-CM | POA: Diagnosis not present

## 2021-11-16 DIAGNOSIS — S60212A Contusion of left wrist, initial encounter: Secondary | ICD-10-CM | POA: Insufficient documentation

## 2021-11-16 DIAGNOSIS — M25532 Pain in left wrist: Secondary | ICD-10-CM

## 2021-11-16 MED ORDER — IBUPROFEN 400 MG PO TABS
600.0000 mg | ORAL_TABLET | Freq: Once | ORAL | Status: AC
  Administered 2021-11-16: 600 mg via ORAL
  Filled 2021-11-16: qty 1

## 2021-11-16 MED ORDER — ONDANSETRON 4 MG PO TBDP
4.0000 mg | ORAL_TABLET | Freq: Once | ORAL | Status: AC
  Administered 2021-11-16: 4 mg via ORAL
  Filled 2021-11-16: qty 1

## 2021-11-16 MED ORDER — METHOCARBAMOL 500 MG PO TABS: 500.0000 mg | ORAL_TABLET | Freq: Two times a day (BID) | ORAL | 0 refills | Status: DC

## 2021-11-16 NOTE — ED Notes (Signed)
Patient Alert and oriented to baseline. Stable and ambulatory to baseline. Patient verbalized understanding of the discharge instructions.  Patient belongings were taken by the patient.   
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
## 2021-11-16 NOTE — Discharge Instructions (Addendum)
The pain you are experiencing is likely due to muscle strain, you may take Ibuprofen and Robaxin as needed for pain management. Do not combine with any pain reliever other than tylenol.  Robaxin can cause drowsiness do not drive while taking this medication.  Patient will Velcro wrist brace that you have at home to help support your wrist.  If wrist pain is not improving please follow-up with your hand surgeon.  You may also use ice and heat, and over-the-counter remedies such as Biofreeze gel or salon pas lidocaine patches. The muscle soreness should improve over the next week. Follow up with your family doctor in the next week for a recheck if you are still having symptoms. Return to ED if pain is worsening, you develop weakness or numbness of extremities, or new or concerning symptoms develop.

## 2021-11-28 DIAGNOSIS — R0789 Other chest pain: Secondary | ICD-10-CM | POA: Diagnosis not present

## 2021-11-28 DIAGNOSIS — M549 Dorsalgia, unspecified: Secondary | ICD-10-CM | POA: Diagnosis not present

## 2021-11-28 DIAGNOSIS — M545 Low back pain, unspecified: Secondary | ICD-10-CM | POA: Diagnosis not present

## 2021-12-05 DIAGNOSIS — M545 Low back pain, unspecified: Secondary | ICD-10-CM | POA: Diagnosis not present

## 2021-12-05 DIAGNOSIS — M542 Cervicalgia: Secondary | ICD-10-CM | POA: Diagnosis not present

## 2021-12-11 DIAGNOSIS — M542 Cervicalgia: Secondary | ICD-10-CM | POA: Diagnosis not present

## 2021-12-11 DIAGNOSIS — M545 Low back pain, unspecified: Secondary | ICD-10-CM | POA: Diagnosis not present

## 2021-12-13 ENCOUNTER — Other Ambulatory Visit (HOSPITAL_BASED_OUTPATIENT_CLINIC_OR_DEPARTMENT_OTHER): Payer: Self-pay | Admitting: Family Medicine

## 2021-12-13 ENCOUNTER — Other Ambulatory Visit: Payer: Self-pay

## 2021-12-13 ENCOUNTER — Ambulatory Visit (HOSPITAL_BASED_OUTPATIENT_CLINIC_OR_DEPARTMENT_OTHER)
Admission: RE | Admit: 2021-12-13 | Discharge: 2021-12-13 | Disposition: A | Discharge status: Home / Self Care | Payer: Medicare Other | Source: Ambulatory Visit | Attending: Family Medicine | Admitting: Family Medicine

## 2021-12-13 DIAGNOSIS — R413 Other amnesia: Secondary | ICD-10-CM | POA: Diagnosis not present

## 2021-12-13 DIAGNOSIS — R519 Headache, unspecified: Secondary | ICD-10-CM | POA: Insufficient documentation

## 2021-12-13 DIAGNOSIS — H539 Unspecified visual disturbance: Secondary | ICD-10-CM | POA: Diagnosis not present

## 2021-12-13 DIAGNOSIS — Z23 Encounter for immunization: Secondary | ICD-10-CM | POA: Diagnosis not present

## 2021-12-18 DIAGNOSIS — M542 Cervicalgia: Secondary | ICD-10-CM | POA: Diagnosis not present

## 2021-12-18 DIAGNOSIS — M545 Low back pain, unspecified: Secondary | ICD-10-CM | POA: Diagnosis not present

## 2021-12-26 DIAGNOSIS — M542 Cervicalgia: Secondary | ICD-10-CM | POA: Diagnosis not present

## 2021-12-26 DIAGNOSIS — M545 Low back pain, unspecified: Secondary | ICD-10-CM | POA: Diagnosis not present

## 2021-12-27 DIAGNOSIS — H43812 Vitreous degeneration, left eye: Secondary | ICD-10-CM | POA: Diagnosis not present

## 2021-12-27 DIAGNOSIS — H43821 Vitreomacular adhesion, right eye: Secondary | ICD-10-CM | POA: Diagnosis not present

## 2022-01-01 DIAGNOSIS — M545 Low back pain, unspecified: Secondary | ICD-10-CM | POA: Diagnosis not present

## 2022-01-01 DIAGNOSIS — M542 Cervicalgia: Secondary | ICD-10-CM | POA: Diagnosis not present

## 2022-01-02 DIAGNOSIS — M25532 Pain in left wrist: Secondary | ICD-10-CM | POA: Diagnosis not present

## 2022-01-06 DIAGNOSIS — M545 Low back pain, unspecified: Secondary | ICD-10-CM | POA: Diagnosis not present

## 2022-01-06 DIAGNOSIS — M542 Cervicalgia: Secondary | ICD-10-CM | POA: Diagnosis not present

## 2022-01-08 DIAGNOSIS — I10 Essential (primary) hypertension: Secondary | ICD-10-CM | POA: Diagnosis not present

## 2022-01-08 DIAGNOSIS — B359 Dermatophytosis, unspecified: Secondary | ICD-10-CM | POA: Diagnosis not present

## 2022-01-08 DIAGNOSIS — J309 Allergic rhinitis, unspecified: Secondary | ICD-10-CM | POA: Diagnosis not present

## 2022-01-17 DIAGNOSIS — M545 Low back pain, unspecified: Secondary | ICD-10-CM | POA: Diagnosis not present

## 2022-01-17 DIAGNOSIS — M542 Cervicalgia: Secondary | ICD-10-CM | POA: Diagnosis not present

## 2022-01-23 DIAGNOSIS — M545 Low back pain, unspecified: Secondary | ICD-10-CM | POA: Diagnosis not present

## 2022-01-23 DIAGNOSIS — M542 Cervicalgia: Secondary | ICD-10-CM | POA: Diagnosis not present

## 2022-01-28 DIAGNOSIS — M545 Low back pain, unspecified: Secondary | ICD-10-CM | POA: Diagnosis not present

## 2022-01-28 DIAGNOSIS — M542 Cervicalgia: Secondary | ICD-10-CM | POA: Diagnosis not present

## 2022-02-03 ENCOUNTER — Ambulatory Visit (INDEPENDENT_AMBULATORY_CARE_PROVIDER_SITE_OTHER): Payer: Self-pay | Admitting: Neurology

## 2022-02-03 ENCOUNTER — Encounter: Payer: Self-pay | Admitting: Neurology

## 2022-02-03 ENCOUNTER — Telehealth: Payer: Self-pay | Admitting: *Deleted

## 2022-02-03 DIAGNOSIS — Z91199 Patient's noncompliance with other medical treatment and regimen due to unspecified reason: Secondary | ICD-10-CM

## 2022-02-03 NOTE — Telephone Encounter (Signed)
No show for appointment today.

## 2022-02-03 NOTE — Progress Notes (Signed)
NO SHOW

## 2022-02-06 ENCOUNTER — Ambulatory Visit: Payer: Medicare Other | Admitting: Podiatry

## 2022-02-06 ENCOUNTER — Encounter: Payer: Self-pay | Admitting: Podiatry

## 2022-02-06 ENCOUNTER — Other Ambulatory Visit: Payer: Self-pay

## 2022-02-06 ENCOUNTER — Ambulatory Visit (INDEPENDENT_AMBULATORY_CARE_PROVIDER_SITE_OTHER): Payer: Medicare Other

## 2022-02-06 DIAGNOSIS — R1310 Dysphagia, unspecified: Secondary | ICD-10-CM | POA: Insufficient documentation

## 2022-02-06 DIAGNOSIS — F329 Major depressive disorder, single episode, unspecified: Secondary | ICD-10-CM | POA: Insufficient documentation

## 2022-02-06 DIAGNOSIS — K5900 Constipation, unspecified: Secondary | ICD-10-CM | POA: Insufficient documentation

## 2022-02-06 DIAGNOSIS — M722 Plantar fascial fibromatosis: Secondary | ICD-10-CM

## 2022-02-06 DIAGNOSIS — K219 Gastro-esophageal reflux disease without esophagitis: Secondary | ICD-10-CM | POA: Insufficient documentation

## 2022-02-06 DIAGNOSIS — G4700 Insomnia, unspecified: Secondary | ICD-10-CM | POA: Insufficient documentation

## 2022-02-06 DIAGNOSIS — K449 Diaphragmatic hernia without obstruction or gangrene: Secondary | ICD-10-CM | POA: Insufficient documentation

## 2022-02-06 DIAGNOSIS — F419 Anxiety disorder, unspecified: Secondary | ICD-10-CM | POA: Insufficient documentation

## 2022-02-06 DIAGNOSIS — Z853 Personal history of malignant neoplasm of breast: Secondary | ICD-10-CM | POA: Insufficient documentation

## 2022-02-06 DIAGNOSIS — E559 Vitamin D deficiency, unspecified: Secondary | ICD-10-CM | POA: Insufficient documentation

## 2022-02-06 DIAGNOSIS — R413 Other amnesia: Secondary | ICD-10-CM | POA: Insufficient documentation

## 2022-02-06 DIAGNOSIS — J309 Allergic rhinitis, unspecified: Secondary | ICD-10-CM | POA: Insufficient documentation

## 2022-02-06 DIAGNOSIS — J45909 Unspecified asthma, uncomplicated: Secondary | ICD-10-CM | POA: Insufficient documentation

## 2022-02-06 DIAGNOSIS — E2839 Other primary ovarian failure: Secondary | ICD-10-CM | POA: Insufficient documentation

## 2022-02-06 DIAGNOSIS — F3342 Major depressive disorder, recurrent, in full remission: Secondary | ICD-10-CM | POA: Insufficient documentation

## 2022-02-06 DIAGNOSIS — G43009 Migraine without aura, not intractable, without status migrainosus: Secondary | ICD-10-CM | POA: Insufficient documentation

## 2022-02-06 DIAGNOSIS — M2550 Pain in unspecified joint: Secondary | ICD-10-CM | POA: Insufficient documentation

## 2022-02-06 MED ORDER — MELOXICAM 15 MG PO TABS: 15.0000 mg | ORAL_TABLET | Freq: Every day | ORAL | 3 refills | Status: DC

## 2022-02-06 MED ORDER — METHYLPREDNISOLONE 4 MG PO TBPK: ORAL_TABLET | ORAL | 0 refills | Status: DC

## 2022-02-06 MED ORDER — TRIAMCINOLONE ACETONIDE 40 MG/ML IJ SUSP
20.0000 mg | Freq: Once | INTRAMUSCULAR | Status: AC
  Administered 2022-02-06: 20 mg

## 2022-02-06 NOTE — Patient Instructions (Signed)

## 2022-02-08 NOTE — Progress Notes (Signed)
Subjective:  Patient ID: Kiara Wilson, female    DOB: 1961-02-16,  MRN: [id number] HPI Chief Complaint  Patient presents with   Foot Pain    Plantar heel right - aching x 3 weeks, AM pain, no injury, Ibuprofen-some help   New Patient (Initial Visit)    Est pt 2018    61 y.o. female presents with the above complaint.   ROS: Denies fever chills nausea vomiting muscle aches pains calf pain back pain chest pain shortness of breath.  Past Medical History:  Diagnosis Date   Allergy    Anxiety    Arthritis    Breast cancer ([place]) 09/2007   Depression    Depression with anxiety 01/08/2020   Essential hypertension 01/08/2020   Fibromyalgia    GERD (gastroesophageal reflux disease)    Hypertension    Migraine 01/08/2020   Migraines    Overactive bladder    Personal history of radiation therapy    Past Surgical History:  Procedure Laterality Date   BREAST BIOPSY Left    benign   BREAST LUMPECTOMY Right 2008   EYE SURGERY     PELVIC LAPAROSCOPY  1992    Current Outpatient Medications:    meloxicam (MOBIC) 15 MG tablet, Take 1 tablet (15 mg total) by mouth daily., Disp: 30 tablet, Rfl: 3   methylPREDNISolone (MEDROL DOSEPAK) 4 MG TBPK tablet, 6 day dose pack - take as directed, Disp: 21 tablet, Rfl: 0   APPLE CIDER VINEGAR PO, Take 2 each by mouth daily., Disp: , Rfl:    cetirizine (ZYRTEC) 10 MG tablet, Take 10 mg by mouth daily., Disp: , Rfl:    Cholecalciferol (VITAMIN D3 PO), Take 5,000 Int'l Units by mouth daily., Disp: , Rfl:    Cyanocobalamin (VITAMIN B-12 PO), Take 500 mcg by mouth daily., Disp: , Rfl:    DULoxetine (CYMBALTA) 30 MG capsule, Take 30 mg by mouth daily., Disp: , Rfl:    fluticasone (FLONASE) 50 MCG/ACT nasal spray, Place into both nostrils., Disp: , Rfl:    losartan (COZAAR) 50 MG tablet, Take 50 mg by mouth daily.  , Disp: , Rfl:    Omega-3 1000 MG CAPS, Take by mouth daily., Disp: , Rfl:    SELENIUM PO, Take 200 mcg by mouth daily., Disp: , Rfl:     traMADol (ULTRAM) 50 MG tablet, as needed., Disp: , Rfl:    vitamin E (VITAMIN E) 180 MG (400 UNITS) capsule, Take 400 Units by mouth daily., Disp: , Rfl:   Allergies  Allergen Reactions   Amoxicillin    Duloxetine Hcl     Other reaction(s): foggy headed on 60 mg   Fexofenadine     Other reaction(s): Unknown   Loratadine     Other reaction(s): Unknown   Naproxen     Other reaction(s): migrane   Pregabalin     Other reaction(s): feet swelling   Trazodone Hcl     Other reaction(s): nightmares   Verapamil     Other reaction(s): constipation   Adhesive [Tape] Rash   Review of Systems Objective:  There were no vitals filed for this visit.  General: Well developed, nourished, in no acute distress, alert and oriented x3   Dermatological: Skin is warm, dry and supple bilateral. Nails x 10 are well maintained; remaining integument appears unremarkable at this time. There are no open sores, no preulcerative lesions, no rash or signs of infection present.  Vascular: Dorsalis Pedis artery and Posterior Tibial artery pedal pulses are 2/4  bilateral with immedate capillary fill time. Pedal hair growth present. No varicosities and no lower extremity edema present bilateral.   Neruologic: Grossly intact via light touch bilateral. Vibratory intact via tuning fork bilateral. Protective threshold with Semmes Wienstein monofilament intact to all pedal sites bilateral. Patellar and Achilles deep tendon reflexes 2+ bilateral. No Babinski or clonus noted bilateral.   Musculoskeletal: No gross boney pedal deformities bilateral. No pain, crepitus, or limitation noted with foot and ankle range of motion bilateral. Muscular strength 5/5 in all groups tested bilateral.  Pain on palpation medial calcaneal tubercle of the right heel.  No pain on medial lateral compression of the calcaneus.  Gait: Unassisted, Nonantalgic.    Radiographs:  Radiographs taken today of the right foot demonstrate an osseously  mature individual small plantar distally oriented calcaneal heel spur soft tissue increase in density plantar fashion calcaneal insertion site.  This is most likely indicative of Planter fasciitis.  No acute findings.  Chronic findings consisting of mild digital adductus also an elevated first metatarsal.  No jamming at the first metatarsophalangeal joint identified.  Assessment & Plan:   Assessment: Planter fasciitis right.  Plan: Discussed etiology pathology conservative versus surgical therapies at this point we discussed injection therapy which we performed to the right heel.  20 mg Kenalog 5 mg Marcaine point maximal tenderness.  Tolerated procedure well without complications.  Start her on a Medrol Dosepak to be followed by meloxicam.  Placed her in a plantar fascia brace to be followed by a night splint.  Discussed appropriate shoe gear stretching exercises ice therapy and shoe gear modifications.  I like to follow-up with her in 1 month to reevaluate progress.     Shaida [address] T. [place], [place]

## 2022-02-10 ENCOUNTER — Ambulatory Visit: Payer: Medicare Other | Admitting: Neurology

## 2022-02-10 ENCOUNTER — Encounter: Payer: Self-pay | Admitting: Neurology

## 2022-02-10 VITALS — BP 139/89 | HR 73 | Ht 64.0 in | Wt 185.8 lb

## 2022-02-10 DIAGNOSIS — R519 Headache, unspecified: Secondary | ICD-10-CM

## 2022-02-10 DIAGNOSIS — F0781 Postconcussional syndrome: Secondary | ICD-10-CM | POA: Diagnosis not present

## 2022-02-10 MED ORDER — CYCLOBENZAPRINE HCL 10 MG PO TABS: 10.0000 mg | ORAL_TABLET | Freq: Every day | ORAL | 3 refills | Status: AC
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
## 2022-02-10 NOTE — Patient Instructions (Addendum)
Start flexeril at bedtime [email or web address] Edwena Felty or PT for dry needling can consider  Post-Concussion Syndrome A concussion is a brain injury from a direct hit to the head or body. This hit causes the brain to shake quickly back and forth inside the skull. This can damage brain cells and cause chemical changes in the brain. Concussions are usually not life-threatening but can cause serious symptoms. Post-concussion syndrome is when symptoms that occur after a concussion last longer than normal. These symptoms can last from weeks to months. What are the causes? The cause of this condition is not known. It can happen whether your head injury was mild or severe. What increases the risk? You are more likely to develop this condition if: You are female. You are a child, teen, or young adult. You have had a past head injury. You have a history of headaches. You have depression or anxiety. You have loss of consciousness or cannot remember the event (have amnesia of the event). You have multiple symptoms or severe symptoms at the time of your concussion. What are the signs or symptoms? Symptoms of this condition include: Physical symptoms. You may have: Headaches. Tiredness. Dizziness and weakness. Blurry vision and sensitivity to light. Hearing difficulties. Problems with balance. Mental and emotional symptoms. You may have: Memory problems and trouble concentrating. Difficulty sleeping or staying asleep. Feelings of irritability. Anxiety or depression. Difficulty learning new things. How is this diagnosed? This condition may be diagnosed based on: Your symptoms. A description of your injury. Your medical history. Testing your strength, balance, and nerve function (neurological examination). Your health care provider may order other tests, including brain imaging such as a CT scan or an MRI, and memory testing (neuropsychological testing). How is this treated? Treatment for this  condition may depend on your symptoms. Symptoms usually go away on their own over time. Treatments may include: Medicines for headaches, anxiety, depression, and trouble sleeping (insomnia). Resting your brain and body for a few days after your injury. Rehabilitation therapy, such as: Physical or occupational therapy. This may include exercises to help with balance and dizziness. Mental health counseling. A form of talk therapy called cognitive behavioral therapy (CBT) can be especially helpful. This therapy helps you set goals and follow up on the changes that you make. Speech therapy. Vision therapy. A brain and eye specialist can recommend treatments for vision problems. Follow these instructions at home: Medicines Take over-the-counter and prescription medicines only as told by your health care provider. Avoid opioid prescription pain medicines when recovering from a concussion. Activity Limit your mental activities for the first few days after your injury. This may include not doing the following: Homework or job-related work. Complex thinking. Watching TV, and using a computer or phone. Playing memory games and puzzles. Gradually return to your normal activity level. If a certain activity brings on your symptoms, stop or slow down until you can do the activity without it triggering your symptoms. Limit physical activity, such as exercise or sports, for the first few days after a concussion. Gradually return to normal activity as told by your health care provider. Rest. Rest helps your brain heal. Make sure you: Get plenty of sleep at night. Most adults should get at least 7-9 hours of sleep each night. Rest during the day. Take naps or rest breaks when you feel tired. Do not do high-risk activities that could cause a second concussion, such as riding a bike or playing sports. Having another concussion before the first one  has healed can be dangerous. General instructions  Do not drink  alcohol until your health care provider says that you can. Keep track of the frequency and the severity of your symptoms. Give this information to your health care provider. Keep all follow-up visits as directed by your health care provider. This is important. This includes visits with specialists. Contact a health care provider if: Your symptoms do not improve. You have another injury. Get help right away if you: Have a severe or worsening headache. Are confused. Have trouble staying awake. Faint. Vomit. Have weakness or numbness in any part of your body. Have a seizure. Have trouble speaking. Summary Post-concussion syndrome is when symptoms that occur after a concussion last longer than normal. Symptoms usually go away on their own over time. Depending on your symptoms, you may need treatment, such as medicines or rehabilitation therapy. Rest your brain and body for a few days after your injury. Gradually return to normal activities as told by your health care provider. Get plenty of sleep, and avoid alcohol and opioid pain medicines while recovering from a concussion. This information is not intended to replace advice given to you by your health care provider. Make sure you discuss any questions you have with your health care provider. Document Revised: 02/21/2021 Document Reviewed: 02/21/2021 Elsevier Patient Education  [place].  Cyclobenzaprine Tablets What is this medication? CYCLOBENZAPRINE (sye kloe BEN za preen) treats muscle spasms. It works by relaxing your muscles, which reduces muscle stiffness. It belongs to a group of medications called muscle relaxants. This medicine may be used for other purposes; ask your health care provider or pharmacist if you have questions. COMMON BRAND NAME(S): Fexmid, Flexeril What should I tell my care team before I take this medication? They need to know if you have any of these conditions: Heart disease, irregular heartbeat, or  previous heart attack Liver disease Thyroid problem An unusual or allergic reaction to cyclobenzaprine, tricyclic antidepressants, lactose, other medications, foods, dyes, or preservatives Pregnant or trying to get pregnant Breast-feeding How should I use this medication? Take this medication by mouth with a glass of water. Follow the directions on the prescription label. If this medication upsets your stomach, take it with food or milk. Take your medication at regular intervals. Do not take it more often than directed. Talk to your care team about the use of this medication in children. Special care may be needed. Overdosage: If you think you have taken too much of this medicine contact a poison control center or emergency room at once. NOTE: This medicine is only for you. Do not share this medicine with others. What if I miss a dose? If you miss a dose, take it as soon as you can. If it is almost time for your next dose, take only that dose. Do not take double or extra doses. What may interact with this medication? Do not take this medication with any of the following: MAOIs like Carbex, Eldepryl, Marplan, Nardil, and Parnate Narcotic medications for cough Safinamide This medication may also interact with the following: Alcohol Bupropion Antihistamines for allergy, cough and cold Certain medications for anxiety or sleep Certain medications for bladder problems like oxybutynin, tolterodine Certain medications for depression like amitriptyline, fluoxetine, sertraline Certain medications for Parkinson's disease like benztropine, trihexyphenidyl Certain medications for seizures like phenobarbital, primidone Certain medications for stomach problems like dicyclomine, hyoscyamine Certain medications for travel sickness like scopolamine General anesthetics like halothane, isoflurane, methoxyflurane, propofol Ipratropium Local anesthetics like lidocaine,  pramoxine, tetracaine Medications  that relax muscles for surgery Narcotic medications for pain Phenothiazines like chlorpromazine, mesoridazine, prochlorperazine, thioridazine Verapamil This list may not describe all possible interactions. Give your health care provider a list of all the medicines, herbs, non-prescription drugs, or dietary supplements you use. Also tell them if you smoke, drink alcohol, or use illegal drugs. Some items may interact with your medicine. What should I watch for while using this medication? Tell your care team if your symptoms do not start to get better or if they get worse. You may get drowsy or dizzy. Do not drive, use machinery, or do anything that needs mental alertness until you know how this medication affects you. Do not stand or sit up quickly, especially if you are an older patient. This reduces the risk of dizzy or fainting spells. Alcohol may interfere with the effect of this medication. Avoid alcoholic drinks. If you are taking another medication that also causes drowsiness, you may have more side effects. Give your care team a list of all medications you use. Your care team will tell you how much medication to take. Do not take more medication than directed. Call emergency for help if you have problems breathing or unusual sleepiness. Your mouth may get dry. Chewing sugarless gum or sucking hard candy, and drinking plenty of water may help. Contact your care team if the problem does not go away or is severe. What side effects may I notice from receiving this medication? Side effects that you should report to your care team as soon as possible: Allergic reactions--skin rash, itching, hives, swelling of the face, lips, tongue, or throat CNS depression--slow or shallow breathing, shortness of breath, feeling faint, dizziness, confusion, trouble staying awake Heart rhythm changes--fast or irregular heartbeat, dizziness, feeling faint or lightheaded, chest pain, trouble breathing Side effects that  usually do not require medical attention (report to your care team if they continue or are bothersome): Constipation Dizziness Drowsiness Dry mouth Fatigue Nausea This list may not describe all possible side effects. Call your doctor for medical advice about side effects. You may report side effects to FDA at 1-800-FDA-1088. Where should I keep my medication? Keep out of the reach of children. Store at room temperature between 15 and 30 degrees C (59 and 86 degrees F). Keep container tightly closed. Throw away any unused medication after the expiration date. NOTE: This sheet is a summary. It may not cover all possible information. If you have questions about this medicine, talk to your doctor, pharmacist, or health care provider.  2022 Elsevier/Gold Standard (2021-03-21 00:00:00)

## 2022-02-27 DIAGNOSIS — R7303 Prediabetes: Secondary | ICD-10-CM | POA: Diagnosis not present

## 2022-02-27 DIAGNOSIS — I10 Essential (primary) hypertension: Secondary | ICD-10-CM | POA: Diagnosis not present

## 2022-02-27 DIAGNOSIS — Z79899 Other long term (current) drug therapy: Secondary | ICD-10-CM | POA: Diagnosis not present

## 2022-02-27 DIAGNOSIS — Z0000 Encounter for general adult medical examination without abnormal findings: Secondary | ICD-10-CM | POA: Diagnosis not present

## 2022-02-27 DIAGNOSIS — J45909 Unspecified asthma, uncomplicated: Secondary | ICD-10-CM | POA: Diagnosis not present

## 2022-02-27 DIAGNOSIS — Z136 Encounter for screening for cardiovascular disorders: Secondary | ICD-10-CM | POA: Diagnosis not present

## 2022-02-27 DIAGNOSIS — M797 Fibromyalgia: Secondary | ICD-10-CM | POA: Diagnosis not present

## 2022-02-27 DIAGNOSIS — Z23 Encounter for immunization: Secondary | ICD-10-CM | POA: Diagnosis not present

## 2022-02-27 DIAGNOSIS — R5383 Other fatigue: Secondary | ICD-10-CM | POA: Diagnosis not present

## 2022-02-27 DIAGNOSIS — E559 Vitamin D deficiency, unspecified: Secondary | ICD-10-CM | POA: Diagnosis not present

## 2022-03-06 ENCOUNTER — Other Ambulatory Visit: Payer: Self-pay

## 2022-03-06 ENCOUNTER — Encounter: Payer: Self-pay | Admitting: Podiatry

## 2022-03-06 ENCOUNTER — Ambulatory Visit: Payer: Medicare Other | Admitting: Podiatry

## 2022-03-06 DIAGNOSIS — S93691A Other sprain of right foot, initial encounter: Secondary | ICD-10-CM | POA: Diagnosis not present

## 2022-03-06 NOTE — Progress Notes (Signed)
She presents today for follow-up of her right heel states that it was seem to be doing okay until this past Monday she states that it really felt a sharp pain and it really started hurting bad she states that it is worse now than it was before. ? ?Objective: Vital signs are stable she is alert and oriented x3 Planter fasciitis of the right foot has resulted in what appears to be a tear of the plantar fascia along the medial band of that right foot.  Pulses are palpable there is mild edema no ecchymosis is visible.  Significant pain on palpation of the insertion site.  Significant pain on medial lateral compression of the calcaneus at its insertion site. ? ?Assessment: Probable tear of the plantar fascia. ? ?Plan: Encouraged her to stay off her foot is much as possible and to continue the plantar fascial brace.  She will utilize her meloxicam and noted extra-strength Tylenol daily I will follow-up with her after her MRI.  MRI will be necessary for differential diagnoses and surgical consideration. ?

## 2022-03-12 ENCOUNTER — Other Ambulatory Visit: Payer: Self-pay

## 2022-03-12 ENCOUNTER — Ambulatory Visit
Admission: RE | Admit: 2022-03-12 | Discharge: 2022-03-12 | Disposition: A | Discharge status: Home / Self Care | Payer: Medicare Other | Source: Ambulatory Visit | Attending: Podiatry | Admitting: Podiatry

## 2022-03-12 DIAGNOSIS — S86311A Strain of muscle(s) and tendon(s) of peroneal muscle group at lower leg level, right leg, initial encounter: Secondary | ICD-10-CM | POA: Diagnosis not present

## 2022-03-12 DIAGNOSIS — S93691A Other sprain of right foot, initial encounter: Secondary | ICD-10-CM

## 2022-03-12 DIAGNOSIS — M65871 Other synovitis and tenosynovitis, right ankle and foot: Secondary | ICD-10-CM | POA: Diagnosis not present

## 2022-03-12 DIAGNOSIS — M722 Plantar fascial fibromatosis: Secondary | ICD-10-CM | POA: Diagnosis not present

## 2022-03-17 ENCOUNTER — Telehealth: Payer: Self-pay | Admitting: *Deleted

## 2022-03-17 NOTE — Telephone Encounter (Signed)
Patient is calling to request the results from MRI test, still in pain, would like those and the next steps moving forward. ?

## 2022-03-18 NOTE — Telephone Encounter (Signed)
Please call patient to schedule appointment.

## 2022-03-18 NOTE — Telephone Encounter (Signed)
Patient is calling for MRI results,please advise 

## 2022-03-18 NOTE — Telephone Encounter (Signed)
Patient has been called and notified thru voice message to call back to schedule an appointment. ?

## 2022-03-19 NOTE — Telephone Encounter (Signed)
Left message for pt to call to schedule an appt to see Dr Milinda Pointer. ?

## 2022-03-20 NOTE — Telephone Encounter (Signed)
Lvm for pt to call to schedule an appt with Dr Milinda Pointer. ?

## 2022-03-21 NOTE — Telephone Encounter (Signed)
Pt called back stating she needs an appt asap with Dr Milinda Pointer for [facility] results. She states she has left several messages and no one has called her back.  ? ?I have explained I have left 2 messages this week to call.  ? ?We have pt scheduled to see Dr Milinda Pointer 4.4.2023.Marland Kitchen ?

## 2022-03-24 DIAGNOSIS — N6310 Unspecified lump in the right breast, unspecified quadrant: Secondary | ICD-10-CM | POA: Diagnosis not present

## 2022-03-25 ENCOUNTER — Ambulatory Visit (INDEPENDENT_AMBULATORY_CARE_PROVIDER_SITE_OTHER): Payer: Medicare Other | Admitting: Podiatry

## 2022-03-25 ENCOUNTER — Ambulatory Visit
Admission: RE | Admit: 2022-03-25 | Discharge: 2022-03-25 | Disposition: A | Discharge status: Home / Self Care | Payer: Medicare Other | Source: Ambulatory Visit | Attending: Family Medicine | Admitting: Family Medicine

## 2022-03-25 ENCOUNTER — Telehealth: Payer: Self-pay | Admitting: Urology

## 2022-03-25 ENCOUNTER — Other Ambulatory Visit: Payer: Self-pay | Admitting: Family Medicine

## 2022-03-25 DIAGNOSIS — S86311A Strain of muscle(s) and tendon(s) of peroneal muscle group at lower leg level, right leg, initial encounter: Secondary | ICD-10-CM | POA: Diagnosis not present

## 2022-03-25 DIAGNOSIS — M25552 Pain in left hip: Secondary | ICD-10-CM | POA: Diagnosis not present

## 2022-03-25 DIAGNOSIS — M545 Low back pain, unspecified: Secondary | ICD-10-CM | POA: Diagnosis not present

## 2022-03-25 DIAGNOSIS — N6310 Unspecified lump in the right breast, unspecified quadrant: Secondary | ICD-10-CM

## 2022-03-25 DIAGNOSIS — Z853 Personal history of malignant neoplasm of breast: Secondary | ICD-10-CM | POA: Diagnosis not present

## 2022-03-25 DIAGNOSIS — S93691D Other sprain of right foot, subsequent encounter: Secondary | ICD-10-CM

## 2022-03-25 DIAGNOSIS — R922 Inconclusive mammogram: Secondary | ICD-10-CM | POA: Diagnosis not present

## 2022-03-25 NOTE — Progress Notes (Signed)
Kiara Wilson presents today with her husband for follow-up of pain to her l right foot.  She states that she still taking anti-inflammatories occasionally and using ice. ? ?Objective: Vital signs are stable alert and oriented x3.  Pulses are palpable.  There is no erythema edema cellulitis drainage odor she has moderate to severe pain on palpation medial calcaneal tubercle and on palpation of the peroneus brevis tendon right foot.  MRI does demonstrate a tear of the peroneus brevis and then moderate to severe Plan[place] fasciitis with a tear. ? ?Assessment: Tear of the plantar fascia tear of the peroneus brevis. ? ?Plan: At this point I did offer her physical therapy however she would opted for surgical repair.  At this point of surgery will consist of primary repair of peroneus brevis tendon and release of the medial band of the plantar fascia.  We discussed the pros and cons of the surgery and the possible complications and side effects which may include but are not limited to postop pain bleeding swelling infection recurrence need for further surgery overcorrection under correction loss of digit loss of limb loss of life. ? ?She consented today we provided her with information regarding the surgery center and anesthesia group.  We will follow-up with her in the near future for surgical intervention. ?

## 2022-03-25 NOTE — Telephone Encounter (Signed)
DOS - 04/04/22 ? ?EPF RIGHT --- [phone] ?REPAIR TENDON RIGHT --- [phone] ?INJECTION RIGHT X'S 2 --- 20550 ? ?[employer] EFFECTIVE DATE - 12/22/21 ? ? ?PLAN DEDUCTIBLE - $0.00 ?OUT OF POCKET - $3,600.00 W/ $3,241.94 REMAINING ?COINSURANCE - 0% ?COPAY - $295.00 ? ? ?PER [place] [id number], [phone] AND [id number] Notification or Prior Authorization is not required for the requested services ? ?Decision ID #:[id number] ?

## 2022-03-26 ENCOUNTER — Telehealth: Payer: Self-pay

## 2022-03-26 NOTE — Telephone Encounter (Signed)
Kiara Wilson called to cancel her surgery with Dr. Milinda Pointer on 04/04/2022. She stated she went to the orthopedic doctor yesterday about her back and he ordered an MRI, which is scheduled for 04/04/2022. She stated she would like to get her back better before she has foot surgery. She will call me back to reschedule. Notified Dr,. [place] and [place] with [place] ?

## 2022-04-04 DIAGNOSIS — M5416 Radiculopathy, lumbar region: Secondary | ICD-10-CM | POA: Diagnosis not present

## 2022-04-10 ENCOUNTER — Encounter: Payer: Medicare Other | Admitting: Podiatry

## 2022-04-11 DIAGNOSIS — M542 Cervicalgia: Secondary | ICD-10-CM | POA: Diagnosis not present

## 2022-04-11 DIAGNOSIS — M545 Low back pain, unspecified: Secondary | ICD-10-CM | POA: Diagnosis not present

## 2022-04-17 ENCOUNTER — Encounter: Payer: Medicare Other | Admitting: Podiatry

## 2022-05-01 ENCOUNTER — Encounter: Payer: Medicare Other | Admitting: Podiatry

## 2022-05-13 ENCOUNTER — Encounter: Payer: Medicare Other | Admitting: Podiatry

## 2022-05-14 ENCOUNTER — Telehealth: Payer: Self-pay | Admitting: Neurology

## 2022-05-14 NOTE — Telephone Encounter (Signed)
I called and LMVM for pt that returning her call.  Did the flexerill help?  Try another of those (robaxin or tizanidine)?  Or maxalt?  If pt does not call back today will touch base tomorrow.
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
## 2022-05-14 NOTE — Telephone Encounter (Signed)
Pt said, continue having migraines everyday,pain level 6. If neurologist could prescribe something. Would like a call from the nurse

## 2022-05-15 ENCOUNTER — Other Ambulatory Visit: Payer: Self-pay | Admitting: Orthopedic Surgery

## 2022-05-15 DIAGNOSIS — M259 Joint disorder, unspecified: Secondary | ICD-10-CM

## 2022-05-15 MED ORDER — RIZATRIPTAN BENZOATE 10 MG PO TBDP: 10.0000 mg | ORAL_TABLET | ORAL | 1 refills | Status: DC | PRN

## 2022-05-15 NOTE — Telephone Encounter (Signed)
I called pt back.. she is having migraine type headaches.  The flexeril she says helps some on her neck, but she has level 6-8 3 x week (pain back of head to bilateral temples.  Some noise sensitivity.  She is doing PT that is not really helping.  She took flexeril and tylenol and ice yesterday that did help.  She is asking if could order maxalt (she has taken previously) for her migraines.  I placed order to start. She did not want to change muscle relaxant.

## 2022-06-02 ENCOUNTER — Encounter (HOSPITAL_BASED_OUTPATIENT_CLINIC_OR_DEPARTMENT_OTHER): Payer: Self-pay

## 2022-06-03 NOTE — Telephone Encounter (Signed)
error 

## 2022-06-09 ENCOUNTER — Other Ambulatory Visit: Payer: Self-pay | Admitting: Internal Medicine

## 2022-06-09 DIAGNOSIS — Z1231 Encounter for screening mammogram for malignant neoplasm of breast: Secondary | ICD-10-CM

## 2022-06-10 ENCOUNTER — Other Ambulatory Visit: Payer: Medicare Other

## 2022-06-12 ENCOUNTER — Ambulatory Visit: Payer: Medicare Other | Attending: Orthopedic Surgery | Admitting: Rehabilitative and Restorative Service Providers"

## 2022-06-12 ENCOUNTER — Other Ambulatory Visit: Payer: Self-pay

## 2022-06-12 ENCOUNTER — Encounter: Payer: Self-pay | Admitting: Rehabilitative and Restorative Service Providers"

## 2022-06-12 DIAGNOSIS — R252 Cramp and spasm: Secondary | ICD-10-CM | POA: Insufficient documentation

## 2022-06-12 DIAGNOSIS — M542 Cervicalgia: Secondary | ICD-10-CM | POA: Insufficient documentation

## 2022-06-12 DIAGNOSIS — M6281 Muscle weakness (generalized): Secondary | ICD-10-CM | POA: Insufficient documentation

## 2022-06-12 DIAGNOSIS — M5459 Other low back pain: Secondary | ICD-10-CM | POA: Insufficient documentation
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
## 2022-06-18 ENCOUNTER — Encounter: Payer: Self-pay | Admitting: Rehabilitative and Restorative Service Providers"

## 2022-06-18 ENCOUNTER — Ambulatory Visit: Payer: Medicare Other | Admitting: Rehabilitative and Restorative Service Providers"

## 2022-06-18 DIAGNOSIS — M5459 Other low back pain: Secondary | ICD-10-CM

## 2022-06-18 DIAGNOSIS — M542 Cervicalgia: Secondary | ICD-10-CM

## 2022-06-18 DIAGNOSIS — R252 Cramp and spasm: Secondary | ICD-10-CM

## 2022-06-18 DIAGNOSIS — M6281 Muscle weakness (generalized): Secondary | ICD-10-CM

## 2022-06-18 NOTE — Patient Instructions (Signed)

## 2022-06-18 NOTE — Therapy (Signed)
OUTPATIENT PHYSICAL THERAPY TREATMENT NOTE   Patient Name: Kiara Wilson MRN: [id number] DOB:1961-02-03, 61 y.o., female Today's Date: 06/18/2022  PCP: Carol Ada, MD REFERRING PROVIDER: Melina Schools, MD  END OF SESSION:   PT End of Session - 06/18/22 1230     Visit Number 2    Date for PT Re-Evaluation 08/08/22    Authorization Type [employer] Medicare    Progress Note Due on Visit 10    PT Start Time 11    PT Stop Time 1310    PT Time Calculation (min) 40 min    Activity Tolerance Patient tolerated treatment well    Behavior During Therapy WFL for tasks assessed/performed             Past Medical History:  Diagnosis Date   Allergy    Anxiety    Arthritis    Breast cancer ([place]) 09/2007   Depression    Depression with anxiety 01/08/2020   Essential hypertension 01/08/2020   Fibromyalgia    GERD (gastroesophageal reflux disease)    Hypertension    Migraine 01/08/2020   Migraines    Overactive bladder    Personal history of radiation therapy    Past Surgical History:  Procedure Laterality Date   BREAST BIOPSY Left    benign   BREAST LUMPECTOMY Right 2008   [place]   Patient Active Problem List   Diagnosis Date Noted   Post concussion syndrome 02/10/2022   Allergic rhinitis 02/06/2022   Amnesia 02/06/2022   Anxiety 02/06/2022   Asthma 02/06/2022   Constipation 02/06/2022   Decreased estrogen level 02/06/2022   Dysphagia 02/06/2022   Gastroesophageal reflux disease 02/06/2022   Hiatal hernia 02/06/2022   Insomnia 02/06/2022   Joint pain 02/06/2022   Major depression, single episode 02/06/2022   Personal history of malignant neoplasm of breast 02/06/2022   Recurrent major depression in full remission ([place]) 02/06/2022   Migraine without aura, not refractory 02/06/2022   Vitamin D deficiency 02/06/2022   Closed fracture of distal end of left radius 10/25/2020   Closed fracture of proximal end of ulna 10/25/2020    Fibromyalgia 01/08/2020   Migraine 01/08/2020   Depression with anxiety 01/08/2020   Essential hypertension 01/08/2020   History of osteopenia 01/20/2018   Pain in right knee 01/20/2018   Breast cancer ([place]) 12/25/2011    REFERRING DIAG: M54.51 (ICD-10-CM) - Vertebrogenic low back pain   THERAPY DIAG:  Other low back pain  Muscle weakness (generalized)  Cramp and spasm  Cervicalgia  Rationale for Evaluation and Treatment Rehabilitation  PERTINENT HISTORY: HTN, breast cancer s/p right lumpectomy in 2008 with 5 lymph nodes removed, left wrist fracture November 2021  PRECAUTIONS: None  SUBJECTIVE: Pt reports that her cervical pain is 4/10, but her lumbar spine is hurting the most.  She would like to try dry needling.  PAIN:  Are you having pain? Yes: NPRS scale: 8/10 Pain location: lumbar spine Pain description: aching and throbbing Aggravating factors: sitting for prolonged periods Relieving factors: lying down  PATIENT GOALS:  To decrease pain to be able to walk and exercise more and perform volunteer work.  OBJECTIVE: (objective measures completed at initial evaluation unless otherwise dated)  DIAGNOSTIC FINDINGS:  Pt reports MRI reveals cyst and mild bulging discs in lumbar spine.  States there were no herniations noted.   PATIENT SURVEYS:    06/12/2022: Modified Oswestry Low Back Pain Disability Questionnaire: 34 / 50 = 68.0 %  FOTO 44% at initial eval (predicted 53% by visit 12)   SCREENING FOR RED FLAGS: Bowel or bladder incontinence: No Spinal tumors: No Cauda equina syndrome: No Compression fracture: No Abdominal aneurysm: No   COGNITION:           Overall cognitive status: Within functional limits for tasks assessed                          SENSATION: WFL   MUSCLE LENGTH: Hamstrings and Piriformis: tightness noted with left more impaired than right   POSTURE: rounded shoulders and forward head   PALPATION: Tightness and trigger points noted in  upper traps, cervical and lumbar multifidi   CERVICAL ROM:   Active  A/PROM  eval  Flexion 40  Extension 35  Right lateral flexion 50  Left lateral flexion 35  Right rotation 60  Left rotation 50      LUMBAR ROM:    Active  A/PROM  eval  Flexion Mid shin with pain  Extension With pain  Right lateral flexion 2 inches from knee  Left lateral flexion 3 inches from knee  Right rotation    Left rotation     (Blank rows = not tested)     LOWER EXTREMITY MMT:     RLE strength of 4 to 4+/5 grossly throughout LLE strength of 4- to 4/5 grossly throughout   FUNCTIONAL TESTS:  5 times sit to stand: 15.6 sec with UE use and increased pain of 8/10 Timed up and go (TUG): 13.5 sec with increased pain of 8/10   GAIT: Distance walked: 200 ft Assistive device utilized: None Level of assistance: Complete Independence Comments: Antalgic gait pattern noted as pt progresses with ambulation       TODAY'S TREATMENT  06/18/2022: Nustep level 3 x6 min with PT present to discuss status Seated on dynadisc:  4 way pelvic tilt x20 each Thoracic Open Book x5 bilat Sidelying pelvic/SI joint shift x10 bilat Sidelying clamshell 2x10 bilat Dying Bug 2x10 Seated:  shoulder ER, shoulder horizontal abduction, rows.  With red tband 2x10 each Trigger Point Dry-Needling  Treatment instructions: Expect mild to moderate muscle soreness. S/S of pneumothorax if dry needled over a lung field, and to seek immediate medical attention should they occur. Patient verbalized understanding of these instructions and education. Patient Consent Given: Yes Education handout provided: Yes Muscles treated: bilateral lumbar multifidi, left piriformis, bilateral rhomboids Electrical stimulation performed: No Parameters: N/A Treatment response/outcome: Utilizing skilled palpation to identify trigger points.  Able to palpate twitch response and muscle elongation with dry needling treatment 3 way green pball roll out  5x10 sec each   06/12/2022: Single knee to chest 5 sec x5 bilat Thoracic Open Book x5 bilat Dying Bug x10     PATIENT EDUCATION:  Education details: Pt educated on HEP Person educated: Patient Education method: Consulting civil engineer, Media planner, and Handouts Education comprehension: verbalized understanding and returned demonstration     HOME EXERCISE PROGRAM: Access Code: [email or web address] URL: [email or web address] Date: 06/12/2022 Prepared by: Shelby Dubin Ina Scrivens   Exercises - Supine Lower Trunk Rotation  - 1 x daily - 7 x weekly - 1 sets - 5 reps - 5-10 sec hold - Supine Hamstring Stretch with Strap  - 1 x daily - 7 x weekly - 1 sets - 2 reps - 20 sec hold - Supine Piriformis Stretch with Foot on Ground  - 1 x daily - 7 x weekly - 1 sets - 2 reps - 20  sec hold - Supine Single Knee to Chest Stretch  - 1 x daily - 7 x weekly - 1 sets - 5 reps - 5-10 sec hold - Dead Bug  - 1 x daily - 7 x weekly - 1 sets - 10 reps - Sidelying Thoracic Rotation with Open Book  - 1 x daily - 7 x weekly - 1 sets - 10 reps - Seated Cervical Retraction  - 1 x daily - 7 x weekly - 2 sets - 10 reps - Seated Upper Trapezius Stretch  - 1 x daily - 7 x weekly - 1 sets - 2 reps - 20 sec hold - Seated Scapular Retraction  - 1 x daily - 7 x weekly - 2 sets - 10 reps   ASSESSMENT:   CLINICAL IMPRESSION: Ms Hoffmann presents to skilled rehabilitation eager to participate stating that the open book stretch has been helping, but she is hoping to try dry needling.  Pt tolerated exercises well and only requires min cuing throughout for technique and positioning.  Pt with muscle spasms noted and proceeded with dry needling utilizing skilled palpation to locate trigger points for treatment.  Following treatment, pt report pain in lumbar area decreased to 3-4/10.  With forward flexion, pt able to increase flexion to a couple of inches away from toes.  Pt reporting not feeling the pulling that she was with standing following dry  needling.  Pt continues to require skilled PT to progress towards goal related activities.     OBJECTIVE IMPAIRMENTS difficulty walking, decreased ROM, decreased strength, increased muscle spasms, impaired flexibility, postural dysfunction, and pain.    ACTIVITY LIMITATIONS carrying, lifting, bending, sitting, standing, squatting, sleeping, and stairs   PARTICIPATION LIMITATIONS: cleaning, laundry, community activity, and yard work   PERSONAL FACTORS Time since onset of injury/illness/exacerbation and 1-2 comorbidities: HTN, Hx of breast cancer  are also affecting patient's functional outcome.    REHAB POTENTIAL: Good   CLINICAL DECISION MAKING: Evolving/moderate complexity   EVALUATION COMPLEXITY: Moderate     GOALS: Goals reviewed with patient? Yes   SHORT TERM GOALS: Target date: 07/03/2022   Pt will be independent with initial HEP. Baseline: Goal status: Ongoing   2.  Pt will report at least a 40% improvement in symptoms since starting PT. Baseline:  Goal status: INITIAL     LONG TERM GOALS: Target date: 08/07/2022   Pt will be independent with advanced HEP. Baseline:  Goal status: INITIAL   2.  Lumbar FOTO to increase to at least 53% to indicate improvements in functional mobility. Baseline: 44% Goal status: INITIAL   3.  Pt to report ability to return to leisurely walking routine of at least 40 minutes with pain no greater than 3/10. Baseline:  Goal status: INITIAL   4.  Pt to increase BLE strength to at least 4+/5 to allow her to perform functional activities like lifting and stair negotiation. Baseline:  Goal status: INITIAL   5.  Pt to improve modified Oswestry score to 50% or less to indicate less back pain with functional mobility. Baseline: 68% Goal status: INITIAL   6.  Pt able to increase cervical A/ROM to allow drive for greater than 30 minutes without reports of increased lumbar or cervical pain. Baseline:  Goal status: INITIAL     PLAN: PT  FREQUENCY: 1-2x/week   PT DURATION: 8 weeks   PLANNED INTERVENTIONS: Therapeutic exercises, Therapeutic activity, Neuromuscular re-education, Balance training, Gait training, Patient/Family education, Joint manipulation, Joint mobilization, Stair training, Aquatic  Therapy, Dry Needling, Electrical stimulation, Spinal manipulation, Spinal mobilization, Cryotherapy, Moist heat, Taping, Traction, Ultrasound, Ionotophoresis '4mg'$ /ml Dexamethasone, Manual therapy, and Re-evaluation.   PLAN FOR NEXT SESSION: assess and progress HEP, assess how pt tolerated dry needling, aquatic PT as indicated, core stability, flexibility, dry needling/manual therapy as indicated.     Juel Burrow, PT 06/18/2022, 1:22 PM  [facility] [address] [place], [place] [address] Phone # [phone] Fax [phone]

## 2022-06-26 ENCOUNTER — Ambulatory Visit: Payer: Medicare Other | Attending: Orthopedic Surgery | Admitting: Rehabilitative and Restorative Service Providers"

## 2022-06-26 ENCOUNTER — Encounter: Payer: Self-pay | Admitting: Rehabilitative and Restorative Service Providers"

## 2022-06-26 DIAGNOSIS — M6281 Muscle weakness (generalized): Secondary | ICD-10-CM | POA: Insufficient documentation

## 2022-06-26 DIAGNOSIS — M542 Cervicalgia: Secondary | ICD-10-CM | POA: Diagnosis present

## 2022-06-26 DIAGNOSIS — R252 Cramp and spasm: Secondary | ICD-10-CM | POA: Insufficient documentation

## 2022-06-26 DIAGNOSIS — M5459 Other low back pain: Secondary | ICD-10-CM | POA: Diagnosis not present

## 2022-06-26 NOTE — Therapy (Signed)
OUTPATIENT PHYSICAL THERAPY TREATMENT NOTE   Patient Name: Kiara Wilson MRN: [id number] DOB:Jul 13, 1961, 61 y.o., female Today's Date: 06/26/2022  PCP: Carol Ada, MD REFERRING PROVIDER: Melina Schools, MD  END OF SESSION:   PT End of Session - 06/26/22 1147     Visit Number 3    Date for PT Re-Evaluation 08/08/22    Authorization Type [employer] Medicare    Progress Note Due on Visit 10    PT Start Time 1145    PT Stop Time 1225    PT Time Calculation (min) 40 min    Activity Tolerance Patient tolerated treatment well    Behavior During Therapy WFL for tasks assessed/performed             Past Medical History:  Diagnosis Date   Allergy    Anxiety    Arthritis    Breast cancer ([place]) 09/2007   Depression    Depression with anxiety 01/08/2020   Essential hypertension 01/08/2020   Fibromyalgia    GERD (gastroesophageal reflux disease)    Hypertension    Migraine 01/08/2020   Migraines    Overactive bladder    Personal history of radiation therapy    Past Surgical History:  Procedure Laterality Date   BREAST BIOPSY Left    benign   BREAST LUMPECTOMY Right 2008   [place]   Patient Active Problem List   Diagnosis Date Noted   Post concussion syndrome 02/10/2022   Allergic rhinitis 02/06/2022   Amnesia 02/06/2022   Anxiety 02/06/2022   Asthma 02/06/2022   Constipation 02/06/2022   Decreased estrogen level 02/06/2022   Dysphagia 02/06/2022   Gastroesophageal reflux disease 02/06/2022   Hiatal hernia 02/06/2022   Insomnia 02/06/2022   Joint pain 02/06/2022   Major depression, single episode 02/06/2022   Personal history of malignant neoplasm of breast 02/06/2022   Recurrent major depression in full remission ([place]) 02/06/2022   Migraine without aura, not refractory 02/06/2022   Vitamin D deficiency 02/06/2022   Closed fracture of distal end of left radius 10/25/2020   Closed fracture of proximal end of ulna 10/25/2020    Fibromyalgia 01/08/2020   Migraine 01/08/2020   Depression with anxiety 01/08/2020   Essential hypertension 01/08/2020   History of osteopenia 01/20/2018   Pain in right knee 01/20/2018   Breast cancer ([place]) 12/25/2011    REFERRING DIAG: M54.51 (ICD-10-CM) - Vertebrogenic low back pain   THERAPY DIAG:  Other low back pain  Muscle weakness (generalized)  Cramp and spasm  Cervicalgia  Rationale for Evaluation and Treatment Rehabilitation  PERTINENT HISTORY: HTN, breast cancer s/p right lumpectomy in 2008 with 5 lymph nodes removed, left wrist fracture November 2021  PRECAUTIONS: None  SUBJECTIVE: Pt reports that her cervical pain is 2/10, and that she is feeling like she has more flexibility in her neck.  Pt states that dry needling helped a lot.  PAIN:  Are you having pain? Yes: NPRS scale: 5/10 Pain location: thoracic and lumbar spine Pain description: aching and throbbing Aggravating factors: sitting for prolonged periods Relieving factors: lying down  PATIENT GOALS:  To decrease pain to be able to walk and exercise more and perform volunteer work.  OBJECTIVE: (objective measures completed at initial evaluation unless otherwise dated)  DIAGNOSTIC FINDINGS:  Pt reports MRI reveals cyst and mild bulging discs in lumbar spine.  States there were no herniations noted.   PATIENT SURVEYS:    06/12/2022: Modified Oswestry Low Back  Pain Disability Questionnaire: 34 / 50 = 68.0 %  FOTO 44% at initial eval (predicted 53% by visit 12)   SCREENING FOR RED FLAGS: Bowel or bladder incontinence: No Spinal tumors: No Cauda equina syndrome: No Compression fracture: No Abdominal aneurysm: No   COGNITION:           Overall cognitive status: Within functional limits for tasks assessed                          SENSATION: WFL   MUSCLE LENGTH: Hamstrings and Piriformis: tightness noted with left more impaired than right   POSTURE: rounded shoulders and forward head    PALPATION: Tightness and trigger points noted in upper traps, cervical and lumbar multifidi   CERVICAL ROM:   Active  A/PROM  eval  Flexion 40  Extension 35  Right lateral flexion 50  Left lateral flexion 35  Right rotation 60  Left rotation 50      LUMBAR ROM:    Active  A/PROM  eval  Flexion Mid shin with pain  Extension With pain  Right lateral flexion 2 inches from knee  Left lateral flexion 3 inches from knee  Right rotation    Left rotation     (Blank rows = not tested)     LOWER EXTREMITY MMT:     RLE strength of 4 to 4+/5 grossly throughout LLE strength of 4- to 4/5 grossly throughout   FUNCTIONAL TESTS:  5 times sit to stand: 15.6 sec with UE use and increased pain of 8/10 Timed up and go (TUG): 13.5 sec with increased pain of 8/10   GAIT: Distance walked: 200 ft Assistive device utilized: None Level of assistance: Complete Independence Comments: Antalgic gait pattern noted as pt progresses with ambulation       TODAY'S TREATMENT  06/26/2022: Nustep level 5 x6 min with PT present to discuss status 90-90 SI Joint Self Correction x10 bilat Hooklying Isometric Hip Abduction Adduction with Black Tband and Ball 2x10 Thoracic Open Book x10 bilat Sidelying clamshell 2x10 bilat Sidelying pelvic/SI joint shift 2x10 bilat Dying bug 2x10 Trigger Point Dry-Needling  Treatment instructions: Expect mild to moderate muscle soreness. S/S of pneumothorax if dry needled over a lung field, and to seek immediate medical attention should they occur. Patient verbalized understanding of these instructions and education. Patient Consent Given: Yes Education handout provided: Yes Muscles treated: left lumbar multifidi, left thoracic multifidi, left piriformis, bilateral rhomboids Electrical stimulation performed: No Parameters: N/A Treatment response/outcome: Utilizing skilled palpation to identify trigger points.  Able to palpate twitch response and muscle elongation  with dry needling treatment  06/18/2022: Nustep level 3 x6 min with PT present to discuss status Seated on dynadisc:  4 way pelvic tilt x20 each Thoracic Open Book x5 bilat Sidelying pelvic/SI joint shift x10 bilat Sidelying clamshell 2x10 bilat Dying Bug 2x10 Seated:  shoulder ER, shoulder horizontal abduction, rows.  With red tband 2x10 each Trigger Point Dry-Needling  Treatment instructions: Expect mild to moderate muscle soreness. S/S of pneumothorax if dry needled over a lung field, and to seek immediate medical attention should they occur. Patient verbalized understanding of these instructions and education. Patient Consent Given: Yes Education handout provided: Yes Muscles treated: bilateral lumbar multifidi, left piriformis, bilateral rhomboids Electrical stimulation performed: No Parameters: N/A Treatment response/outcome: Utilizing skilled palpation to identify trigger points.  Able to palpate twitch response and muscle elongation with dry needling treatment 3 way green pball roll out  5x10 sec each   06/12/2022: Single knee to chest 5 sec x5 bilat Thoracic Open Book x5 bilat Dying Bug x10     PATIENT EDUCATION:  Education details: Pt educated on HEP Person educated: Patient Education method: Consulting civil engineer, Media planner, and Handouts Education comprehension: verbalized understanding and returned demonstration     HOME EXERCISE PROGRAM: Access Code: [email or web address] URL: [email or web address] Date: 06/26/2022 Prepared by: Shelby Dubin Darreon Lutes  Exercises - Supine Lower Trunk Rotation  - 1 x daily - 7 x weekly - 1 sets - 5 reps - 5-10 sec hold - Supine Hamstring Stretch with Strap  - 1 x daily - 7 x weekly - 1 sets - 2 reps - 20 sec hold - Supine Piriformis Stretch with Foot on Ground  - 1 x daily - 7 x weekly - 1 sets - 2 reps - 20 sec hold - Supine Single Knee to Chest Stretch  - 1 x daily - 7 x weekly - 1 sets - 5 reps - 5-10 sec hold - Dead Bug  - 1 x daily - 7 x  weekly - 1 sets - 10 reps - Sidelying Thoracic Rotation with Open Book  - 1 x daily - 7 x weekly - 1 sets - 10 reps - Seated Cervical Retraction  - 1 x daily - 7 x weekly - 2 sets - 10 reps - Seated Upper Trapezius Stretch  - 1 x daily - 7 x weekly - 1 sets - 2 reps - 20 sec hold - Seated Scapular Retraction  - 1 x daily - 7 x weekly - 2 sets - 10 reps - 90/90 SI Joint Self-Correction  - 1 x daily - 7 x weekly - 2 sets - 10 reps - Hooklying Isometric Hip Abduction Adduction with Belt and Ball  - 1 x daily - 7 x weekly - 2 sets - 10 reps   ASSESSMENT:   CLINICAL IMPRESSION: Ms Clippinger presents to skilled rehabilitation eager to participate stating that she felt great relief with use of dry needling last session and wanting to try it again.  Pt states that she can still feel that her SI joint is misaligned and added SI joint self correction today.  Pt continues to progress with goal related activities.  Following dry needling this session, pt reports that pain decreases to 2/10 following dry needling.  Pt continues to require skilled PT to progress towards goal related activities.     OBJECTIVE IMPAIRMENTS difficulty walking, decreased ROM, decreased strength, increased muscle spasms, impaired flexibility, postural dysfunction, and pain.    ACTIVITY LIMITATIONS carrying, lifting, bending, sitting, standing, squatting, sleeping, and stairs   PARTICIPATION LIMITATIONS: cleaning, laundry, community activity, and yard work   PERSONAL FACTORS Time since onset of injury/illness/exacerbation and 1-2 comorbidities: HTN, Hx of breast cancer  are also affecting patient's functional outcome.    REHAB POTENTIAL: Good   CLINICAL DECISION MAKING: Evolving/moderate complexity   EVALUATION COMPLEXITY: Moderate     GOALS: Goals reviewed with patient? Yes   SHORT TERM GOALS: Target date: 07/03/2022   Pt will be independent with initial HEP. Baseline: Goal status: Goal Met   2.  Pt will report at least  a 40% improvement in symptoms since starting PT. Baseline:  Goal status: INITIAL     LONG TERM GOALS: Target date: 08/07/2022   Pt will be independent with advanced HEP. Baseline:  Goal status: INITIAL   2.  Lumbar FOTO to increase to at least 53% to indicate improvements in functional mobility.  Baseline: 44% Goal status: INITIAL   3.  Pt to report ability to return to leisurely walking routine of at least 40 minutes with pain no greater than 3/10. Baseline:  Goal status: INITIAL   4.  Pt to increase BLE strength to at least 4+/5 to allow her to perform functional activities like lifting and stair negotiation. Baseline:  Goal status: INITIAL   5.  Pt to improve modified Oswestry score to 50% or less to indicate less back pain with functional mobility. Baseline: 68% Goal status: INITIAL   6.  Pt able to increase cervical A/ROM to allow drive for greater than 30 minutes without reports of increased lumbar or cervical pain. Baseline:  Goal status: INITIAL     PLAN: PT FREQUENCY: 1-2x/week   PT DURATION: 8 weeks   PLANNED INTERVENTIONS: Therapeutic exercises, Therapeutic activity, Neuromuscular re-education, Balance training, Gait training, Patient/Family education, Joint manipulation, Joint mobilization, Stair training, Aquatic Therapy, Dry Needling, Electrical stimulation, Spinal manipulation, Spinal mobilization, Cryotherapy, Moist heat, Taping, Traction, Ultrasound, Ionotophoresis 19m/ml Dexamethasone, Manual therapy, and Re-evaluation.   PLAN FOR NEXT SESSION: assess and progress HEP, assess how pt tolerated dry needling, aquatic PT as indicated, core stability, flexibility, dry needling/manual therapy as indicated.     SJuel Burrow PT 06/26/2022, 12:34 PM  [facility] [address] [place] [address]Phone # [phone] [phone]

## 2022-07-02 ENCOUNTER — Ambulatory Visit: Payer: Medicare Other | Admitting: Rehabilitative and Restorative Service Providers"

## 2022-07-02 ENCOUNTER — Encounter: Payer: Self-pay | Admitting: Rehabilitative and Restorative Service Providers"

## 2022-07-02 DIAGNOSIS — R252 Cramp and spasm: Secondary | ICD-10-CM

## 2022-07-02 DIAGNOSIS — M542 Cervicalgia: Secondary | ICD-10-CM

## 2022-07-02 DIAGNOSIS — M5459 Other low back pain: Secondary | ICD-10-CM

## 2022-07-02 DIAGNOSIS — M6281 Muscle weakness (generalized): Secondary | ICD-10-CM

## 2022-07-02 NOTE — Patient Instructions (Signed)
     [facility] Welcome to [facility]! Here you will find all the information you will need regarding your pool therapy. If you have further questions at any time, please call our office at [phone]. After completing your initial evaluation in the [facility], you may be eligible to complete a portion of your therapy in the pool. A typical week of therapy will consist of 1-2 typical physical therapy visits at our [facility] location and an additional session of therapy in the pool located at the [facility] at [address]. [address], [place] [address]. The phone number at the pool site is [phone]. Please call this number if you are running late or need to cancel your appointment.  Aquatic therapy will be offered on Wednesday mornings and Friday afternoons. Each session will last approximately 45 minutes. All scheduling and payments for aquatic therapy sessions, including cancelations, will be done through our [facility] location.  To be eligible for aquatic therapy, these criteria must be met: You must be able to independently change in the locker room and get to the pool deck. A caregiver can come with you to help if needed. There are benches for a caregiver to sit on next to the pool. No one with an open wound is permitted in the pool.  Handicap parking is available in the front and there is a drop off option for even closer accessibility. Please arrive 15 minutes prior to your appointment to prepare for your pool session. You must sign in at the front desk upon your arrival. Please be sure to attend to any toileting needs prior to entering the pool. Locker rooms for changing are available.  There is direct access to the pool deck from the locker room. You can lock your belongings in a locker or bring them with you poolside. Your therapist will greet you on the pool deck. There may be other swimmers in the pool at the  same time but your session is one-on-one with the therapist.   

## 2022-07-02 NOTE — Therapy (Signed)
OUTPATIENT PHYSICAL THERAPY TREATMENT NOTE   Patient Name: Nikie Cid MRN: [id number] DOB:[birth year], 61 y.o., female Today's Date: 07/02/2022  PCP: Carol Ada, MD REFERRING PROVIDER: Melina Schools, MD  END OF SESSION:   PT End of Session - 07/02/22 1236     Visit Number 4    Date for PT Re-Evaluation 08/08/22    Authorization Type [employer] Medicare    Progress Note Due on Visit 10    PT Start Time 29    PT Stop Time 1310    PT Time Calculation (min) 40 min    Activity Tolerance Patient tolerated treatment well    Behavior During Therapy WFL for tasks assessed/performed             Past Medical History:  Diagnosis Date   Allergy    Anxiety    Arthritis    Breast cancer ([place]) 09/2007   Depression    Depression with anxiety 01/08/2020   Essential hypertension 01/08/2020   Fibromyalgia    GERD (gastroesophageal reflux disease)    Hypertension    Migraine 01/08/2020   Migraines    Overactive bladder    Personal history of radiation therapy    Past Surgical History:  Procedure Laterality Date   BREAST BIOPSY Left    benign   BREAST LUMPECTOMY Right 2008   [place]   Patient Active Problem List   Diagnosis Date Noted   Post concussion syndrome 02/10/2022   Allergic rhinitis 02/06/2022   Amnesia 02/06/2022   Anxiety 02/06/2022   Asthma 02/06/2022   Constipation 02/06/2022   Decreased estrogen level 02/06/2022   Dysphagia 02/06/2022   Gastroesophageal reflux disease 02/06/2022   Hiatal hernia 02/06/2022   Insomnia 02/06/2022   Joint pain 02/06/2022   Major depression, single episode 02/06/2022   Personal history of malignant neoplasm of breast 02/06/2022   Recurrent major depression in full remission ([place]) 02/06/2022   Migraine without aura, not refractory 02/06/2022   Vitamin D deficiency 02/06/2022   Closed fracture of distal end of left radius 10/25/2020   Closed fracture of proximal end of ulna 10/25/2020    Fibromyalgia 01/08/2020   Migraine 01/08/2020   Depression with anxiety 01/08/2020   Essential hypertension 01/08/2020   History of osteopenia 01/20/2018   Pain in right knee 01/20/2018   Breast cancer ([place]) 12/25/2011    REFERRING DIAG: M54.51 (ICD-10-CM) - Vertebrogenic low back pain   THERAPY DIAG:  Other low back pain  Muscle weakness (generalized)  Cramp and spasm  Cervicalgia  Rationale for Evaluation and Treatment Rehabilitation  PERTINENT HISTORY: HTN, breast cancer s/p right lumpectomy in 2008 with 5 lymph nodes removed, left wrist fracture November 2021  PRECAUTIONS: None  SUBJECTIVE: Pt reports that her cervical pain is 0-1/10, without headaches.  Pt reports that overall, she is feeling 50% better since starting PT.  PAIN:  Are you having pain? Yes: NPRS scale: 3/10 Pain location: thoracic and lumbar spine Pain description: aching and throbbing Aggravating factors: sitting for prolonged periods Relieving factors: lying down  PATIENT GOALS:  To decrease pain to be able to walk and exercise more and perform volunteer work.  OBJECTIVE: (objective measures completed at initial evaluation unless otherwise dated)  DIAGNOSTIC FINDINGS:  Pt reports MRI reveals cyst and mild bulging discs in lumbar spine.  States there were no herniations noted.   PATIENT SURVEYS:    06/12/2022: Modified Oswestry Low Back Pain Disability Questionnaire: 34 / 50 =  68.0 %  FOTO 44% at initial eval (predicted 53% by visit 12)    MUSCLE LENGTH: Hamstrings and Piriformis: tightness noted with left more impaired than right   POSTURE: rounded shoulders and forward head   PALPATION: Tightness and trigger points noted in upper traps, cervical and lumbar multifidi   CERVICAL ROM:   Active  A/PROM  eval  Flexion 40  Extension 35  Right lateral flexion 50  Left lateral flexion 35  Right rotation 60  Left rotation 50      LUMBAR ROM:    Active  A/ROM  eval A/ROM 07/02/22   Flexion Mid shin with pain Touching toes  Extension With pain "Very light pain"  Right lateral flexion 2 inches from knee 1 inch past knee  Left lateral flexion 3 inches from knee 1 inch past knee  Right rotation     Left rotation      (Blank rows = not tested)     LOWER EXTREMITY MMT:     RLE strength of 4 to 4+/5 grossly throughout LLE strength of 4- to 4/5 grossly throughout   FUNCTIONAL TESTS:  5 times sit to stand: 15.6 sec with UE use and increased pain of 8/10 Timed up and go (TUG): 13.5 sec with increased pain of 8/10   GAIT: Distance walked: 200 ft Assistive device utilized: None Level of assistance: Complete Independence Comments: Antalgic gait pattern noted as pt progresses with ambulation       TODAY'S TREATMENT: 07/02/2022: Nustep level 5 x6 min with PT present to discuss status Seated 3 way green pball rollout 5 x 10 sec each Unilateral leg long sitting on mat hamstring stretch 2x20 sec bilat Seated piriformis stretch 2x20 bilat Seated on green pball:  4D pelvic tilt, marching, LAQ.  2x10 bilat Seated on mat:  LAQ with hip adduction ball squeeze combined 2x10 bilat Sit to/from stand x10   06/26/2022: Nustep level 5 x6 min with PT present to discuss status 90-90 SI Joint Self Correction x10 bilat Hooklying Isometric Hip Abduction Adduction with Black Tband and Ball 2x10 Thoracic Open Book x10 bilat Sidelying clamshell 2x10 bilat Sidelying pelvic/SI joint shift 2x10 bilat Dying bug 2x10 Trigger Point Dry-Needling  Treatment instructions: Expect mild to moderate muscle soreness. S/S of pneumothorax if dry needled over a lung field, and to seek immediate medical attention should they occur. Patient verbalized understanding of these instructions and education. Patient Consent Given: Yes Education handout provided: Yes Muscles treated: left lumbar multifidi, left thoracic multifidi, left piriformis, bilateral rhomboids Electrical stimulation performed:  No Parameters: N/A Treatment response/outcome: Utilizing skilled palpation to identify trigger points.  Able to palpate twitch response and muscle elongation with dry needling treatment  06/18/2022: Nustep level 3 x6 min with PT present to discuss status Seated on dynadisc:  4 way pelvic tilt x20 each Thoracic Open Book x5 bilat Sidelying pelvic/SI joint shift x10 bilat Sidelying clamshell 2x10 bilat Dying Bug 2x10 Seated:  shoulder ER, shoulder horizontal abduction, rows.  With red tband 2x10 each Trigger Point Dry-Needling  Treatment instructions: Expect mild to moderate muscle soreness. S/S of pneumothorax if dry needled over a lung field, and to seek immediate medical attention should they occur. Patient verbalized understanding of these instructions and education. Patient Consent Given: Yes Education handout provided: Yes Muscles treated: bilateral lumbar multifidi, left piriformis, bilateral rhomboids Electrical stimulation performed: No Parameters: N/A Treatment response/outcome: Utilizing skilled palpation to identify trigger points.  Able to palpate twitch response and muscle elongation with dry  needling treatment 3 way green pball roll out 5x10 sec each   06/12/2022: Single knee to chest 5 sec x5 bilat Thoracic Open Book x5 bilat Dying Bug x10     PATIENT EDUCATION:  Education details: Pt educated on HEP Person educated: Patient Education method: Consulting civil engineer, Media planner, and Handouts Education comprehension: verbalized understanding and returned demonstration     HOME EXERCISE PROGRAM: Access Code: [email or web address] URL: [email or web address] Date: 06/26/2022 Prepared by: Shelby Dubin Lewi Drost  Exercises - Supine Lower Trunk Rotation  - 1 x daily - 7 x weekly - 1 sets - 5 reps - 5-10 sec hold - Supine Hamstring Stretch with Strap  - 1 x daily - 7 x weekly - 1 sets - 2 reps - 20 sec hold - Supine Piriformis Stretch with Foot on Ground  - 1 x daily - 7 x weekly - 1  sets - 2 reps - 20 sec hold - Supine Single Knee to Chest Stretch  - 1 x daily - 7 x weekly - 1 sets - 5 reps - 5-10 sec hold - Dead Bug  - 1 x daily - 7 x weekly - 1 sets - 10 reps - Sidelying Thoracic Rotation with Open Book  - 1 x daily - 7 x weekly - 1 sets - 10 reps - Seated Cervical Retraction  - 1 x daily - 7 x weekly - 2 sets - 10 reps - Seated Upper Trapezius Stretch  - 1 x daily - 7 x weekly - 1 sets - 2 reps - 20 sec hold - Seated Scapular Retraction  - 1 x daily - 7 x weekly - 2 sets - 10 reps - 90/90 SI Joint Self-Correction  - 1 x daily - 7 x weekly - 2 sets - 10 reps - Hooklying Isometric Hip Abduction Adduction with Belt and Ball  - 1 x daily - 7 x weekly - 2 sets - 10 reps   ASSESSMENT:   CLINICAL IMPRESSION: Ms Bleecker presents to skilled rehabilitation reporting great relief with using dry needling.  Pt able to progress with exercises and add in additional strengthening exercises and core stability.  Pt with notable improved lumbar A/ROM and is able to perform A/ROM with decreased pain.  Pt continues to require skilled PT to progress towards goal related activities and decreased pain.     OBJECTIVE IMPAIRMENTS difficulty walking, decreased ROM, decreased strength, increased muscle spasms, impaired flexibility, postural dysfunction, and pain.    ACTIVITY LIMITATIONS carrying, lifting, bending, sitting, standing, squatting, sleeping, and stairs   PARTICIPATION LIMITATIONS: cleaning, laundry, community activity, and yard work   PERSONAL FACTORS Time since onset of injury/illness/exacerbation and 1-2 comorbidities: HTN, Hx of breast cancer  are also affecting patient's functional outcome.    REHAB POTENTIAL: Good   CLINICAL DECISION MAKING: Evolving/moderate complexity   EVALUATION COMPLEXITY: Moderate     GOALS: Goals reviewed with patient? Yes   SHORT TERM GOALS: Target date: 07/03/2022   Pt will be independent with initial HEP. Baseline: Goal status: Goal Met    2.  Pt will report at least a 40% improvement in symptoms since starting PT. Baseline:  Goal status: Goal Met 07/02/2022     LONG TERM GOALS: Target date: 08/07/2022   Pt will be independent with advanced HEP. Baseline:  Goal status: INITIAL   2.  Lumbar FOTO to increase to at least 53% to indicate improvements in functional mobility. Baseline: 44% Goal status: INITIAL   3.  Pt to report ability to return to  leisurely walking routine of at least 40 minutes with pain no greater than 3/10. Baseline:  Goal status: INITIAL   4.  Pt to increase BLE strength to at least 4+/5 to allow her to perform functional activities like lifting and stair negotiation. Baseline:  Goal status: INITIAL   5.  Pt to improve modified Oswestry score to 50% or less to indicate less back pain with functional mobility. Baseline: 68% Goal status: INITIAL   6.  Pt able to increase cervical A/ROM to allow drive for greater than 30 minutes without reports of increased lumbar or cervical pain. Baseline:  Goal status: INITIAL     PLAN: PT FREQUENCY: 1-2x/week   PT DURATION: 8 weeks   PLANNED INTERVENTIONS: Therapeutic exercises, Therapeutic activity, Neuromuscular re-education, Balance training, Gait training, Patient/Family education, Joint manipulation, Joint mobilization, Stair training, Aquatic Therapy, Dry Needling, Electrical stimulation, Spinal manipulation, Spinal mobilization, Cryotherapy, Moist heat, Taping, Traction, Ultrasound, Ionotophoresis 63m/ml Dexamethasone, Manual therapy, and Re-evaluation.   PLAN FOR NEXT SESSION: assess and progress HEP, assess how pt tolerated dry needling, aquatic PT as indicated, core stability, flexibility, dry needling/manual therapy as indicated.     SJuel Burrow PT 07/02/2022, 1:17 PM  [facility] [place] [address]Phone # [phone] [phone]

## 2022-07-09 ENCOUNTER — Ambulatory Visit: Payer: Medicare Other | Admitting: Rehabilitative and Restorative Service Providers"

## 2022-07-09 ENCOUNTER — Encounter: Payer: Self-pay | Admitting: Rehabilitative and Restorative Service Providers"

## 2022-07-09 DIAGNOSIS — M542 Cervicalgia: Secondary | ICD-10-CM

## 2022-07-09 DIAGNOSIS — M6281 Muscle weakness (generalized): Secondary | ICD-10-CM

## 2022-07-09 DIAGNOSIS — M5459 Other low back pain: Secondary | ICD-10-CM

## 2022-07-09 DIAGNOSIS — R252 Cramp and spasm: Secondary | ICD-10-CM

## 2022-07-09 NOTE — Therapy (Signed)
OUTPATIENT PHYSICAL THERAPY TREATMENT NOTE   Patient Name: Kiara Wilson MRN: [id number] DOB:28-Jun-1961, 61 y.o., female Today's Date: 07/09/2022  PCP: Carol Ada, MD REFERRING PROVIDER: Melina Schools, MD  END OF SESSION:   PT End of Session - 07/09/22 1236     Visit Number 5    Date for PT Re-Evaluation 08/08/22    Authorization Type [employer] Medicare    Progress Note Due on Visit 10    PT Start Time 1231    PT Stop Time 1310    PT Time Calculation (min) 39 min    Activity Tolerance Patient tolerated treatment well    Behavior During Therapy WFL for tasks assessed/performed             Past Medical History:  Diagnosis Date   Allergy    Anxiety    Arthritis    Breast cancer ([place]) 09/2007   Depression    Depression with anxiety 01/08/2020   Essential hypertension 01/08/2020   Fibromyalgia    GERD (gastroesophageal reflux disease)    Hypertension    Migraine 01/08/2020   Migraines    Overactive bladder    Personal history of radiation therapy    Past Surgical History:  Procedure Laterality Date   BREAST BIOPSY Left    benign   BREAST LUMPECTOMY Right 2008   [place]   Patient Active Problem List   Diagnosis Date Noted   Post concussion syndrome 02/10/2022   Allergic rhinitis 02/06/2022   Amnesia 02/06/2022   Anxiety 02/06/2022   Asthma 02/06/2022   Constipation 02/06/2022   Decreased estrogen level 02/06/2022   Dysphagia 02/06/2022   Gastroesophageal reflux disease 02/06/2022   Hiatal hernia 02/06/2022   Insomnia 02/06/2022   Joint pain 02/06/2022   Major depression, single episode 02/06/2022   Personal history of malignant neoplasm of breast 02/06/2022   Recurrent major depression in full remission ([place]) 02/06/2022   Migraine without aura, not refractory 02/06/2022   Vitamin D deficiency 02/06/2022   Closed fracture of distal end of left radius 10/25/2020   Closed fracture of proximal end of ulna 10/25/2020    Fibromyalgia 01/08/2020   Migraine 01/08/2020   Depression with anxiety 01/08/2020   Essential hypertension 01/08/2020   History of osteopenia 01/20/2018   Pain in right knee 01/20/2018   Breast cancer ([place]) 12/25/2011    REFERRING DIAG: M54.51 (ICD-10-CM) - Vertebrogenic low back pain   THERAPY DIAG:  Other low back pain  Muscle weakness (generalized)  Cramp and spasm  Cervicalgia  Rationale for Evaluation and Treatment Rehabilitation  PERTINENT HISTORY: HTN, breast cancer s/p right lumpectomy in 2008 with 5 lymph nodes removed, left wrist fracture November 2021  PRECAUTIONS: None  SUBJECTIVE: Pt reports that her cervical pain is 0/10 and states that she is still having some back pain.  PAIN:  Are you having pain? Yes: NPRS scale: 1-2/10 Pain location: thoracic and lumbar spine Pain description: aching and throbbing Aggravating factors: sitting for prolonged periods Relieving factors: lying down  PATIENT GOALS:  To decrease pain to be able to walk and exercise more and perform volunteer work.  OBJECTIVE: (objective measures completed at initial evaluation unless otherwise dated)  DIAGNOSTIC FINDINGS:  Pt reports MRI reveals cyst and mild bulging discs in lumbar spine.  States there were no herniations noted.   PATIENT SURVEYS:    06/12/2022: Modified Oswestry Low Back Pain Disability Questionnaire: 34 / 50 = 68.0 %  FOTO 44%  at initial eval (predicted 53% by visit 12)    MUSCLE LENGTH: Hamstrings and Piriformis: tightness noted with left more impaired than right   POSTURE: rounded shoulders and forward head   PALPATION: Tightness and trigger points noted in upper traps, cervical and lumbar multifidi   CERVICAL ROM:   Active  A/PROM  eval  Flexion 40  Extension 35  Right lateral flexion 50  Left lateral flexion 35  Right rotation 60  Left rotation 50      LUMBAR ROM:    Active  A/ROM  eval A/ROM 07/02/22  Flexion Mid shin with pain Touching toes   Extension With pain "Very light pain"  Right lateral flexion 2 inches from knee 1 inch past knee  Left lateral flexion 3 inches from knee 1 inch past knee  Right rotation     Left rotation      (Blank rows = not tested)     LOWER EXTREMITY MMT:     RLE strength of 4 to 4+/5 grossly throughout LLE strength of 4- to 4/5 grossly throughout   FUNCTIONAL TESTS:  5 times sit to stand: 15.6 sec with UE use and increased pain of 8/10 Timed up and go (TUG): 13.5 sec with increased pain of 8/10   GAIT: Distance walked: 200 ft Assistive device utilized: None Level of assistance: Complete Independence Comments: Antalgic gait pattern noted as pt progresses with ambulation       TODAY'S TREATMENT: 07/09/2022: Nustep level 6 x6 min with PT present to discuss status Seated 3 way green pball rollout 5 x 10 sec each Seated on dynadisc:  4 way pelvic tilt x20 each Sit to/from stand with 5# kettlebell:  x10 with chest press, x10 with overhead press Seated on mat:  LAQ with hip adduction ball squeeze combined 2x10 bilat with 2# ankle weights Seated piriformis stretch 2x20 bilat Trigger Point Dry-Needling  Treatment instructions: Expect mild to moderate muscle soreness. S/S of pneumothorax if dry needled over a lung field, and to seek immediate medical attention should they occur. Patient verbalized understanding of these instructions and education. Patient Consent Given: Yes Education handout provided: Yes Muscles treated: bilat lumbar multifidi, bilat thoracic multifidi Electrical stimulation performed: No Parameters: N/A Treatment response/outcome: Utilizing skilled palpation to identify trigger points.  Able to palpate twitch response and muscle elongation with dry needling treatment  07/02/2022: Nustep level 5 x6 min with PT present to discuss status Seated 3 way green pball rollout 5 x 10 sec each Unilateral leg long sitting on mat hamstring stretch 2x20 sec bilat Seated piriformis  stretch 2x20 bilat Seated on green pball:  4D pelvic tilt, marching, LAQ.  2x10 bilat Seated on mat:  LAQ with hip adduction ball squeeze combined 2x10 bilat Sit to/from stand x10   06/26/2022: Nustep level 5 x6 min with PT present to discuss status 90-90 SI Joint Self Correction x10 bilat Hooklying Isometric Hip Abduction Adduction with Black Tband and Ball 2x10 Thoracic Open Book x10 bilat Sidelying clamshell 2x10 bilat Sidelying pelvic/SI joint shift 2x10 bilat Dying bug 2x10 Trigger Point Dry-Needling  Treatment instructions: Expect mild to moderate muscle soreness. S/S of pneumothorax if dry needled over a lung field, and to seek immediate medical attention should they occur. Patient verbalized understanding of these instructions and education. Patient Consent Given: Yes Education handout provided: Yes Muscles treated: left lumbar multifidi, left thoracic multifidi, left piriformis, bilateral rhomboids Electrical stimulation performed: No Parameters: N/A Treatment response/outcome: Utilizing skilled palpation to identify trigger points.  Able to palpate twitch response and muscle elongation with dry needling treatment      PATIENT EDUCATION:  Education details: Pt educated on HEP Person educated: Patient Education method: Consulting civil engineer, Media planner, and Handouts Education comprehension: verbalized understanding and returned demonstration     HOME EXERCISE PROGRAM: Access Code: [email or web address] URL: [email or web address] Date: 06/26/2022 Prepared by: Shelby Dubin Torrion Witter  Exercises - Supine Lower Trunk Rotation  - 1 x daily - 7 x weekly - 1 sets - 5 reps - 5-10 sec hold - Supine Hamstring Stretch with Strap  - 1 x daily - 7 x weekly - 1 sets - 2 reps - 20 sec hold - Supine Piriformis Stretch with Foot on Ground  - 1 x daily - 7 x weekly - 1 sets - 2 reps - 20 sec hold - Supine Single Knee to Chest Stretch  - 1 x daily - 7 x weekly - 1 sets - 5 reps - 5-10 sec hold - Dead  Bug  - 1 x daily - 7 x weekly - 1 sets - 10 reps - Sidelying Thoracic Rotation with Open Book  - 1 x daily - 7 x weekly - 1 sets - 10 reps - Seated Cervical Retraction  - 1 x daily - 7 x weekly - 2 sets - 10 reps - Seated Upper Trapezius Stretch  - 1 x daily - 7 x weekly - 1 sets - 2 reps - 20 sec hold - Seated Scapular Retraction  - 1 x daily - 7 x weekly - 2 sets - 10 reps - 90/90 SI Joint Self-Correction  - 1 x daily - 7 x weekly - 2 sets - 10 reps - Hooklying Isometric Hip Abduction Adduction with Belt and Ball  - 1 x daily - 7 x weekly - 2 sets - 10 reps   ASSESSMENT:   CLINICAL IMPRESSION: Ms Ruark presents to skilled rehabilitation reporting compliance with HEP and overall feeling better.  Pt requested dry needling again, as she has had great relief with it and would like one additional session prior to starting aquatic PT next visit.  Pt continues to progress with improved posture and body mechanics during session. Pt reports that she can now walk approx 20-30 minutes before she starts having increased pain.  Pt would benefit from continued PT and initiation of aquatic PT to continue to progress towards goal related activities.     OBJECTIVE IMPAIRMENTS difficulty walking, decreased ROM, decreased strength, increased muscle spasms, impaired flexibility, postural dysfunction, and pain.    ACTIVITY LIMITATIONS carrying, lifting, bending, sitting, standing, squatting, sleeping, and stairs   PARTICIPATION LIMITATIONS: cleaning, laundry, community activity, and yard work   PERSONAL FACTORS Time since onset of injury/illness/exacerbation and 1-2 comorbidities: HTN, Hx of breast cancer  are also affecting patient's functional outcome.    REHAB POTENTIAL: Good   CLINICAL DECISION MAKING: Evolving/moderate complexity   EVALUATION COMPLEXITY: Moderate     GOALS: Goals reviewed with patient? Yes   SHORT TERM GOALS: Target date: 07/03/2022   Pt will be independent with initial  HEP. Baseline: Goal status: Goal Met   2.  Pt will report at least a 40% improvement in symptoms since starting PT. Baseline:  Goal status: Goal Met 07/02/2022     LONG TERM GOALS: Target date: 08/07/2022   Pt will be independent with advanced HEP. Baseline:  Goal status: Ongoing   2.  Lumbar FOTO to increase to at least 53% to indicate improvements in functional mobility. Baseline: 44% Goal status:  INITIAL   3.  Pt to report ability to return to leisurely walking routine of at least 40 minutes with pain no greater than 3/10. Baseline: On 07/09/22 pt reports that she can walk for 30 min before pain starts to increase Goal status: Ongoing   4.  Pt to increase BLE strength to at least 4+/5 to allow her to perform functional activities like lifting and stair negotiation. Baseline:  Goal status: INITIAL   5.  Pt to improve modified Oswestry score to 50% or less to indicate less back pain with functional mobility. Baseline: 68% Goal status: INITIAL   6.  Pt able to increase cervical A/ROM to allow drive for greater than 30 minutes without reports of increased lumbar or cervical pain. Baseline:  Goal status: INITIAL     PLAN: PT FREQUENCY: 1-2x/week   PT DURATION: 8 weeks   PLANNED INTERVENTIONS: Therapeutic exercises, Therapeutic activity, Neuromuscular re-education, Balance training, Gait training, Patient/Family education, Joint manipulation, Joint mobilization, Stair training, Aquatic Therapy, Dry Needling, Electrical stimulation, Spinal manipulation, Spinal mobilization, Cryotherapy, Moist heat, Taping, Traction, Ultrasound, Ionotophoresis 51m/ml Dexamethasone, Manual therapy, and Re-evaluation.   PLAN FOR NEXT SESSION: assess and progress HEP, assess how pt tolerated dry needling, aquatic PT as indicated, core stability, flexibility, dry needling/manual therapy as indicated.     SShelby DubinMenke, PT 07/09/2022, 1:30 PM  [facility] [address]Phone # [phone] [phone]

## 2022-07-17 NOTE — Therapy (Signed)
OUTPATIENT PHYSICAL THERAPY TREATMENT NOTE   Patient Name: Kiara Wilson MRN: [id number] DOB:[birth year], 61 y.o., female Today's Date: 07/18/2022  PCP: Carol Ada, MD REFERRING PROVIDER: Melina Schools, MD  END OF SESSION:   PT End of Session - 07/18/22 1341     Visit Number 6    Date for PT Re-Evaluation 08/08/22    Authorization Type [employer]    Progress Note Due on Visit 10    PT Start Time 1341    PT Stop Time 1425    PT Time Calculation (min) 44 min    Activity Tolerance Patient tolerated treatment well    Behavior During Therapy WFL for tasks assessed/performed              Past Medical History:  Diagnosis Date   Allergy    Anxiety    Arthritis    Breast cancer ([place]) 09/2007   Depression    Depression with anxiety 01/08/2020   Essential hypertension 01/08/2020   Fibromyalgia    GERD (gastroesophageal reflux disease)    Hypertension    Migraine 01/08/2020   Migraines    Overactive bladder    Personal history of radiation therapy    Past Surgical History:  Procedure Laterality Date   BREAST BIOPSY Left    benign   BREAST LUMPECTOMY Right 2008   [facility]   Patient Active Problem List   Diagnosis Date Noted   Post concussion syndrome 02/10/2022   Allergic rhinitis 02/06/2022   Amnesia 02/06/2022   Anxiety 02/06/2022   Asthma 02/06/2022   Constipation 02/06/2022   Decreased estrogen level 02/06/2022   Dysphagia 02/06/2022   Gastroesophageal reflux disease 02/06/2022   Hiatal hernia 02/06/2022   Insomnia 02/06/2022   Joint pain 02/06/2022   Major depression, single episode 02/06/2022   Personal history of malignant neoplasm of breast 02/06/2022   Recurrent major depression in full remission ([place]) 02/06/2022   Migraine without aura, not refractory 02/06/2022   Vitamin D deficiency 02/06/2022   Closed fracture of distal end of left radius 10/25/2020   Closed fracture of proximal end of ulna 10/25/2020    Fibromyalgia 01/08/2020   Migraine 01/08/2020   Depression with anxiety 01/08/2020   Essential hypertension 01/08/2020   History of osteopenia 01/20/2018   Pain in right knee 01/20/2018   Breast cancer ([place]) 12/25/2011    REFERRING DIAG: M54.51 (ICD-10-CM) - Vertebrogenic low back pain   THERAPY DIAG:  Other low back pain  Muscle weakness (generalized)  Cramp and spasm  Cervicalgia  Rationale for Evaluation and Treatment Rehabilitation  PERTINENT HISTORY: HTN, breast cancer s/p right lumpectomy in 2008 with 5 lymph nodes removed, left wrist fracture November 2021  PRECAUTIONS: None  SUBJECTIVE: Pt reports overall she is doing good. Has a little pinch in her LT low back.  PAIN:  Are you having pain? Yes: NPRS scale: 1-2/10 Pain location:  lumbar spine Pain description: pinchy Aggravating factors: sitting for prolonged periods Relieving factors: lying down  PATIENT GOALS:  To decrease pain to be able to walk and exercise more and perform volunteer work.  OBJECTIVE: (objective measures completed at initial evaluation unless otherwise dated)  DIAGNOSTIC FINDINGS:  Pt reports MRI reveals cyst and mild bulging discs in lumbar spine.  States there were no herniations noted.   PATIENT SURVEYS:    06/12/2022: Modified Oswestry Low Back Pain Disability Questionnaire: 34 / 50 = 68.0 %  FOTO 44% at initial eval (predicted  53% by visit 12)    MUSCLE LENGTH: Hamstrings and Piriformis: tightness noted with left more impaired than right   POSTURE: rounded shoulders and forward head   PALPATION: Tightness and trigger points noted in upper traps, cervical and lumbar multifidi   CERVICAL ROM:   Active  A/PROM  eval  Flexion 40  Extension 35  Right lateral flexion 50  Left lateral flexion 35  Right rotation 60  Left rotation 50      LUMBAR ROM:    Active  A/ROM  eval A/ROM 07/02/22  Flexion Mid shin with pain Touching toes  Extension With pain "Very light pain"   Right lateral flexion 2 inches from knee 1 inch past knee  Left lateral flexion 3 inches from knee 1 inch past knee  Right rotation     Left rotation      (Blank rows = not tested)     LOWER EXTREMITY MMT:     RLE strength of 4 to 4+/5 grossly throughout LLE strength of 4- to 4/5 grossly throughout   FUNCTIONAL TESTS:  5 times sit to stand: 15.6 sec with UE use and increased pain of 8/10 Timed up and go (TUG): 13.5 sec with increased pain of 8/10   GAIT: Distance walked: 200 ft Assistive device utilized: None Level of assistance: Complete Independence Comments: Antalgic gait pattern noted as pt progresses with ambulation       TODAY'S TREATMENT:   07/18/22:Pt arrives for aquatic physical therapy. Treatment took place in 3.5-5.5 feet of water. Water temperature was.91 degrees F. Pt entered the pool via steps reciprocally with minor use of rails. Pt requires buoyancy of water for support and to offload joints with strengthening exercises.   Seated water bench with 75% submersion Pt performed seated LE AROM exercises 20x in all planes, PTA educated pt in water principles and how we would be using them. Pt verbally understood.  75% submersion; water walking with small blue noodle for postural support 6x in each direction. VC to not walk on her tippie toes. Wall LE kicks 10x each direction BIL, VC to not go into too much hip extension.  Post pelvic tilt against wall 5 sec hold 5x Standing at noodle big marching 10x Bil Thoracic rotations with yellow noodle: easy twists 1 min Seated decompression with large noodle behind patient. PTA providing later sway for trunk mobilization. Intermittent LE AROM then full horizontal decompression float with 100% flotation and PTA providing lateral sway to mobilize trunk.   07/09/2022: Nustep level 6 x6 min with PT present to discuss status Seated 3 way green pball rollout 5 x 10 sec each Seated on dynadisc:  4 way pelvic tilt x20 each Sit to/from  stand with 5# kettlebell:  x10 with chest press, x10 with overhead press Seated on mat:  LAQ with hip adduction ball squeeze combined 2x10 bilat with 2# ankle weights Seated piriformis stretch 2x20 bilat Trigger Point Dry-Needling  Treatment instructions: Expect mild to moderate muscle soreness. S/S of pneumothorax if dry needled over a lung field, and to seek immediate medical attention should they occur. Patient verbalized understanding of these instructions and education. Patient Consent Given: Yes Education handout provided: Yes Muscles treated: bilat lumbar multifidi, bilat thoracic multifidi Electrical stimulation performed: No Parameters: N/A Treatment response/outcome: Utilizing skilled palpation to identify trigger points.  Able to palpate twitch response and muscle elongation with dry needling treatment  07/02/2022: Nustep level 5 x6 min with PT present to discuss status Seated 3 way green  pball rollout 5 x 10 sec each Unilateral leg long sitting on mat hamstring stretch 2x20 sec bilat Seated piriformis stretch 2x20 bilat Seated on green pball:  4D pelvic tilt, marching, LAQ.  2x10 bilat Seated on mat:  LAQ with hip adduction ball squeeze combined 2x10 bilat Sit to/from stand x10   06/26/2022: Nustep level 5 x6 min with PT present to discuss status 90-90 SI Joint Self Correction x10 bilat Hooklying Isometric Hip Abduction Adduction with Black Tband and Ball 2x10 Thoracic Open Book x10 bilat Sidelying clamshell 2x10 bilat Sidelying pelvic/SI joint shift 2x10 bilat Dying bug 2x10 Trigger Point Dry-Needling  Treatment instructions: Expect mild to moderate muscle soreness. S/S of pneumothorax if dry needled over a lung field, and to seek immediate medical attention should they occur. Patient verbalized understanding of these instructions and education. Patient Consent Given: Yes Education handout provided: Yes Muscles treated: left lumbar multifidi, left thoracic multifidi, left  piriformis, bilateral rhomboids Electrical stimulation performed: No Parameters: N/A Treatment response/outcome: Utilizing skilled palpation to identify trigger points.  Able to palpate twitch response and muscle elongation with dry needling treatment      PATIENT EDUCATION:  Education details: Pt educated on HEP Person educated: Patient Education method: Consulting civil engineer, Media planner, and Handouts Education comprehension: verbalized understanding and returned demonstration     HOME EXERCISE PROGRAM: Access Code: [email or web address] URL: [email or web address] Date: 06/26/2022 Prepared by: Shelby Dubin Menke  Exercises - Supine Lower Trunk Rotation  - 1 x daily - 7 x weekly - 1 sets - 5 reps - 5-10 sec hold - Supine Hamstring Stretch with Strap  - 1 x daily - 7 x weekly - 1 sets - 2 reps - 20 sec hold - Supine Piriformis Stretch with Foot on Ground  - 1 x daily - 7 x weekly - 1 sets - 2 reps - 20 sec hold - Supine Single Knee to Chest Stretch  - 1 x daily - 7 x weekly - 1 sets - 5 reps - 5-10 sec hold - Dead Bug  - 1 x daily - 7 x weekly - 1 sets - 10 reps - Sidelying Thoracic Rotation with Open Book  - 1 x daily - 7 x weekly - 1 sets - 10 reps - Seated Cervical Retraction  - 1 x daily - 7 x weekly - 2 sets - 10 reps - Seated Upper Trapezius Stretch  - 1 x daily - 7 x weekly - 1 sets - 2 reps - 20 sec hold - Seated Scapular Retraction  - 1 x daily - 7 x weekly - 2 sets - 10 reps - 90/90 SI Joint Self-Correction  - 1 x daily - 7 x weekly - 2 sets - 10 reps - Hooklying Isometric Hip Abduction Adduction with Belt and Ball  - 1 x daily - 7 x weekly - 2 sets - 10 reps   ASSESSMENT:   CLINICAL IMPRESSION: Pt arrives for first aquatic PT treatment. Pt reports mild pinching in her  left low back possibly SI area. Pinching was greatly relieved with post pelvic tilt on the wall. Pt was abe to participate in all beginner level aquatic exercises without pain. In fact, pt reported pain free  throughout session.   OBJECTIVE IMPAIRMENTS difficulty walking, decreased ROM, decreased strength, increased muscle spasms, impaired flexibility, postural dysfunction, and pain.    ACTIVITY LIMITATIONS carrying, lifting, bending, sitting, standing, squatting, sleeping, and stairs   PARTICIPATION LIMITATIONS: cleaning, laundry, community activity, and yard work   PERSONAL FACTORS Time since  onset of injury/illness/exacerbation and 1-2 comorbidities: HTN, Hx of breast cancer  are also affecting patient's functional outcome.    REHAB POTENTIAL: Good   CLINICAL DECISION MAKING: Evolving/moderate complexity   EVALUATION COMPLEXITY: Moderate     GOALS: Goals reviewed with patient? Yes   SHORT TERM GOALS: Target date: 07/03/2022   Pt will be independent with initial HEP. Baseline: Goal status: Goal Met   2.  Pt will report at least a 40% improvement in symptoms since starting PT. Baseline:  Goal status: Goal Met 07/02/2022     LONG TERM GOALS: Target date: 08/07/2022   Pt will be independent with advanced HEP. Baseline:  Goal status: Ongoing   2.  Lumbar FOTO to increase to at least 53% to indicate improvements in functional mobility. Baseline: 44% Goal status: INITIAL   3.  Pt to report ability to return to leisurely walking routine of at least 40 minutes with pain no greater than 3/10. Baseline: On 07/09/22 pt reports that she can walk for 30 min before pain starts to increase Goal status: Ongoing   4.  Pt to increase BLE strength to at least 4+/5 to allow her to perform functional activities like lifting and stair negotiation. Baseline:  Goal status: INITIAL   5.  Pt to improve modified Oswestry score to 50% or less to indicate less back pain with functional mobility. Baseline: 68% Goal status: INITIAL   6.  Pt able to increase cervical A/ROM to allow drive for greater than 30 minutes without reports of increased lumbar or cervical pain. Baseline:  Goal status:  INITIAL     PLAN: PT FREQUENCY: 1-2x/week   PT DURATION: 8 weeks   PLANNED INTERVENTIONS: Therapeutic exercises, Therapeutic activity, Neuromuscular re-education, Balance training, Gait training, Patient/Family education, Joint manipulation, Joint mobilization, Stair training, Aquatic Therapy, Dry Needling, Electrical stimulation, Spinal manipulation, Spinal mobilization, Cryotherapy, Moist heat, Taping, Traction, Ultrasound, Ionotophoresis 52m/ml Dexamethasone, Manual therapy, and Re-evaluation.   PLAN FOR NEXT SESSION: Aquatic #2    JMyrene Galas PTA 07/18/22 2:35 PM   [facility]St[facility] CenterSpecial[facility] Rehab Services [address] [place] [place] [id number] # [phone] [phone]

## 2022-07-18 ENCOUNTER — Encounter: Payer: Self-pay | Admitting: Physical Therapy

## 2022-07-18 ENCOUNTER — Ambulatory Visit: Payer: Medicare Other | Admitting: Physical Therapy

## 2022-07-18 DIAGNOSIS — M542 Cervicalgia: Secondary | ICD-10-CM

## 2022-07-18 DIAGNOSIS — M5459 Other low back pain: Secondary | ICD-10-CM

## 2022-07-18 DIAGNOSIS — M6281 Muscle weakness (generalized): Secondary | ICD-10-CM

## 2022-07-18 DIAGNOSIS — R252 Cramp and spasm: Secondary | ICD-10-CM

## 2022-07-23 ENCOUNTER — Ambulatory Visit
Admission: RE | Admit: 2022-07-23 | Discharge: 2022-07-23 | Disposition: A | Discharge status: Home / Self Care | Payer: Medicare Other | Source: Ambulatory Visit | Attending: Internal Medicine | Admitting: Internal Medicine

## 2022-07-23 DIAGNOSIS — Z1231 Encounter for screening mammogram for malignant neoplasm of breast: Secondary | ICD-10-CM

## 2022-07-24 NOTE — Therapy (Signed)
OUTPATIENT PHYSICAL THERAPY TREATMENT NOTE   Patient Name: Kiara Wilson MRN: [id number] DOB:1961/06/23, 61 y.o., female Today's Date: 07/25/2022  PCP: Carol Ada, MD REFERRING PROVIDER: Melina Schools, MD  END OF SESSION:   PT End of Session - 07/25/22 1501     Visit Number 7    Date for PT Re-Evaluation 08/08/22    Authorization Type [employer]    Progress Note Due on Visit 10    PT Start Time 1345    PT Stop Time 1430    PT Time Calculation (min) 45 min    Activity Tolerance Patient tolerated treatment well    Behavior During Therapy [facility] for tasks assessed/performed               Past Medical History:  Diagnosis Date   Allergy    Anxiety    Arthritis    Breast cancer ([place]) 09/2007   Depression    Depression with anxiety 01/08/2020   Essential hypertension 01/08/2020   Fibromyalgia    GERD (gastroesophageal reflux disease)    Hypertension    Migraine 01/08/2020   Migraines    Overactive bladder    Personal history of radiation therapy    Past Surgical History:  Procedure Laterality Date   BREAST BIOPSY Left    benign   BREAST LUMPECTOMY Right 2008   [place]   Patient Active Problem List   Diagnosis Date Noted   Post concussion syndrome 02/10/2022   Allergic rhinitis 02/06/2022   Amnesia 02/06/2022   Anxiety 02/06/2022   Asthma 02/06/2022   Constipation 02/06/2022   Decreased estrogen level 02/06/2022   Dysphagia 02/06/2022   Gastroesophageal reflux disease 02/06/2022   Hiatal hernia 02/06/2022   Insomnia 02/06/2022   Joint pain 02/06/2022   Major depression, single episode 02/06/2022   Personal history of malignant neoplasm of breast 02/06/2022   Recurrent major depression in full remission ([place]) 02/06/2022   Migraine without aura, not refractory 02/06/2022   Vitamin D deficiency 02/06/2022   Closed fracture of distal end of left radius 10/25/2020   Closed fracture of proximal end of ulna 10/25/2020    Fibromyalgia 01/08/2020   Migraine 01/08/2020   Depression with anxiety 01/08/2020   Essential hypertension 01/08/2020   History of osteopenia 01/20/2018   Pain in right knee 01/20/2018   Breast cancer ([place]) 12/25/2011    REFERRING DIAG: M54.51 (ICD-10-CM) - Vertebrogenic low back pain   THERAPY DIAG:  Muscle weakness (generalized)  Other low back pain  Cramp and spasm  Cervicalgia  Rationale for Evaluation and Treatment Rehabilitation  PERTINENT HISTORY: HTN, breast cancer s/p right lumpectomy in 2008 with 5 lymph nodes removed, left wrist fracture November 2021  PRECAUTIONS: None  SUBJECTIVE: I felt so good after my first water exercise. No pain for 2 days, and now it is down to 1-3/10.  PAIN:  Are you having pain? Yes: NPRS scale: 1-2/10 Pain location:  lumbar spine Pain description: pinchy Aggravating factors: sitting for prolonged periods Relieving factors: lying down  PATIENT GOALS:  To decrease pain to be able to walk and exercise more and perform volunteer work.  OBJECTIVE: (objective measures completed at initial evaluation unless otherwise dated)  DIAGNOSTIC FINDINGS:  Pt reports MRI reveals cyst and mild bulging discs in lumbar spine.  States there were no herniations noted.   PATIENT SURVEYS:    06/12/2022: Modified Oswestry Low Back Pain Disability Questionnaire: 34 / 50 = 68.0 %  FOTO 44% at initial eval (predicted 53% by visit 12)    MUSCLE LENGTH: Hamstrings and Piriformis: tightness noted with left more impaired than right   POSTURE: rounded shoulders and forward head   PALPATION: Tightness and trigger points noted in upper traps, cervical and lumbar multifidi   CERVICAL ROM:   Active  A/PROM  eval  Flexion 40  Extension 35  Right lateral flexion 50  Left lateral flexion 35  Right rotation 60  Left rotation 50      LUMBAR ROM:    Active  A/ROM  eval A/ROM 07/02/22  Flexion Mid shin with pain Touching toes  Extension With  pain "Very light pain"  Right lateral flexion 2 inches from knee 1 inch past knee  Left lateral flexion 3 inches from knee 1 inch past knee  Right rotation     Left rotation      (Blank rows = not tested)     LOWER EXTREMITY MMT:     RLE strength of 4 to 4+/5 grossly throughout LLE strength of 4- to 4/5 grossly throughout   FUNCTIONAL TESTS:  5 times sit to stand: 15.6 sec with UE use and increased pain of 8/10 Timed up and go (TUG): 13.5 sec with increased pain of 8/10   GAIT: Distance walked: 200 ft Assistive device utilized: None Level of assistance: Complete Independence Comments: Antalgic gait pattern noted as pt progresses with ambulation       TODAY'S TREATMENT:  07/25/22:Pt arrives for aquatic physical therapy. Treatment took place in 3.5-5.5 feet of water. Water temperature was.91 degrees F. Pt entered the pool via steps reciprocally with minor use of rails. Pt requires buoyancy of water for support and to offload joints with strengthening exercises.   Seated water bench with 75% submersion Pt performed seated LE AROM exercises 20x in all planes, PTA reviewed current status . 75% submersion; water walking with small blue noodle for postural support 10x in each direction. VC to not walk on her tippie toes. Wall LE kicks 15x each direction BIL, VC to not go into too much hip extension.  Post pelvic tilt against wall 5 sec hold 5x Standing at noodle high knee marching across pool 10x Thoracic rotations with yellow noodle: easy twists 1 min Horizontal decompression float with 100% flotation and PTA providing lateral sway to mobilize trunk.    07/18/22:Pt arrives for aquatic physical therapy. Treatment took place in 3.5-5.5 feet of water. Water temperature was.91 degrees F. Pt entered the pool via steps reciprocally with minor use of rails. Pt requires buoyancy of water for support and to offload joints with strengthening exercises.   Seated water bench with 75% submersion Pt  performed seated LE AROM exercises 20x in all planes, PTA educated pt in water principles and how we would be using them. Pt verbally understood.  75% submersion; water walking with small blue noodle for postural support 6x in each direction. VC to not walk on her tippie toes. Wall LE kicks 10x each direction BIL, VC to not go into too much hip extension.  Post pelvic tilt against wall 5 sec hold 5x Standing at noodle big marching 10x Bil Thoracic rotations with yellow noodle: easy twists 1 min Seated decompression with large noodle behind patient. PTA providing later sway for trunk mobilization. Intermittent LE AROM then full horizontal decompression float with 100% flotation and PTA providing lateral sway to mobilize trunk.   07/09/2022: Nustep level 6 x6 min with PT present to discuss status Seated   3 way green pball rollout 5 x 10 sec each Seated on dynadisc:  4 way pelvic tilt x20 each Sit to/from stand with 5# kettlebell:  x10 with chest press, x10 with overhead press Seated on mat:  LAQ with hip adduction ball squeeze combined 2x10 bilat with 2# ankle weights Seated piriformis stretch 2x20 bilat Trigger Point Dry-Needling  Treatment instructions: Expect mild to moderate muscle soreness. S/S of pneumothorax if dry needled over a lung field, and to seek immediate medical attention should they occur. Patient verbalized understanding of these instructions and education. Patient Consent Given: Yes Education handout provided: Yes Muscles treated: bilat lumbar multifidi, bilat thoracic multifidi Electrical stimulation performed: No Parameters: N/A Treatment response/outcome: Utilizing skilled palpation to identify trigger points.  Able to palpate twitch response and muscle elongation with dry needling treatment  07/02/2022: Nustep level 5 x6 min with PT present to discuss status Seated 3 way green pball rollout 5 x 10 sec each Unilateral leg long sitting on mat hamstring stretch 2x20 sec  bilat Seated piriformis stretch 2x20 bilat Seated on green pball:  4D pelvic tilt, marching, LAQ.  2x10 bilat Seated on mat:  LAQ with hip adduction ball squeeze combined 2x10 bilat Sit to/from stand x10   06/26/2022: Nustep level 5 x6 min with PT present to discuss status 90-90 SI Joint Self Correction x10 bilat Hooklying Isometric Hip Abduction Adduction with Black Tband and Ball 2x10 Thoracic Open Book x10 bilat Sidelying clamshell 2x10 bilat Sidelying pelvic/SI joint shift 2x10 bilat Dying bug 2x10 Trigger Point Dry-Needling  Treatment instructions: Expect mild to moderate muscle soreness. S/S of pneumothorax if dry needled over a lung field, and to seek immediate medical attention should they occur. Patient verbalized understanding of these instructions and education. Patient Consent Given: Yes Education handout provided: Yes Muscles treated: left lumbar multifidi, left thoracic multifidi, left piriformis, bilateral rhomboids Electrical stimulation performed: No Parameters: N/A Treatment response/outcome: Utilizing skilled palpation to identify trigger points.  Able to palpate twitch response and muscle elongation with dry needling treatment      PATIENT EDUCATION:  Education details: Pt educated on HEP Person educated: Patient Education method: Explanation, Demonstration, and Handouts Education comprehension: verbalized understanding and returned demonstration     HOME EXERCISE PROGRAM: Access Code: [email or web address] URL: [email or web address] Date: 06/26/2022 Prepared by: Shaunda Menke  Exercises - Supine Lower Trunk Rotation  - 1 x daily - 7 x weekly - 1 sets - 5 reps - 5-10 sec hold - Supine Hamstring Stretch with Strap  - 1 x daily - 7 x weekly - 1 sets - 2 reps - 20 sec hold - Supine Piriformis Stretch with Foot on Ground  - 1 x daily - 7 x weekly - 1 sets - 2 reps - 20 sec hold - Supine Single Knee to Chest Stretch  - 1 x daily - 7 x weekly - 1 sets - 5 reps -  5-10 sec hold - Dead Bug  - 1 x daily - 7 x weekly - 1 sets - 10 reps - Sidelying Thoracic Rotation with Open Book  - 1 x daily - 7 x weekly - 1 sets - 10 reps - Seated Cervical Retraction  - 1 x daily - 7 x weekly - 2 sets - 10 reps - Seated Upper Trapezius Stretch  - 1 x daily - 7 x weekly - 1 sets - 2 reps - 20 sec hold - Seated Scapular Retraction  - 1 x daily - 7 x weekly - 2 sets -   10 reps - 90/90 SI Joint Self-Correction  - 1 x daily - 7 x weekly - 2 sets - 10 reps - Hooklying Isometric Hip Abduction Adduction with Belt and Ball  - 1 x daily - 7 x weekly - 2 sets - 10 reps   ASSESSMENT:   CLINICAL IMPRESSION: Pt arrives with low level Lt low back pain. Pt reports good results from first aquatic session. Pt was pain free for 2 days. Pt's program progressed slowly: increasing reps mainly. Pt with much improved pelvic tilt.  OBJECTIVE IMPAIRMENTS difficulty walking, decreased ROM, decreased strength, increased muscle spasms, impaired flexibility, postural dysfunction, and pain.    ACTIVITY LIMITATIONS carrying, lifting, bending, sitting, standing, squatting, sleeping, and stairs   PARTICIPATION LIMITATIONS: cleaning, laundry, community activity, and yard work   PERSONAL FACTORS Time since onset of injury/illness/exacerbation and 1-2 comorbidities: HTN, Hx of breast cancer  are also affecting patient's functional outcome.    REHAB POTENTIAL: Good   CLINICAL DECISION MAKING: Evolving/moderate complexity   EVALUATION COMPLEXITY: Moderate     GOALS: Goals reviewed with patient? Yes   SHORT TERM GOALS: Target date: 07/03/2022   Pt will be independent with initial HEP. Baseline: Goal status: Goal Met   2.  Pt will report at least a 40% improvement in symptoms since starting PT. Baseline:  Goal status: Goal Met 07/02/2022     LONG TERM GOALS: Target date: 08/07/2022   Pt will be independent with advanced HEP. Baseline:  Goal status: Ongoing   2.  Lumbar FOTO to increase to  at least 53% to indicate improvements in functional mobility. Baseline: 44% Goal status: INITIAL   3.  Pt to report ability to return to leisurely walking routine of at least 40 minutes with pain no greater than 3/10. Baseline: On 07/09/22 pt reports that she can walk for 30 min before pain starts to increase Goal status: Ongoing   4.  Pt to increase BLE strength to at least 4+/5 to allow her to perform functional activities like lifting and stair negotiation. Baseline:  Goal status: INITIAL   5.  Pt to improve modified Oswestry score to 50% or less to indicate less back pain with functional mobility. Baseline: 68% Goal status: INITIAL   6.  Pt able to increase cervical A/ROM to allow drive for greater than 30 minutes without reports of increased lumbar or cervical pain. Baseline:  Goal status: INITIAL     PLAN: PT FREQUENCY: 1-2x/week   PT DURATION: 8 weeks   PLANNED INTERVENTIONS: Therapeutic exercises, Therapeutic activity, Neuromuscular re-education, Balance training, Gait training, Patient/Family education, Joint manipulation, Joint mobilization, Stair training, Aquatic Therapy, Dry Needling, Electrical stimulation, Spinal manipulation, Spinal mobilization, Cryotherapy, Moist heat, Taping, Traction, Ultrasound, Ionotophoresis 66m/ml Dexamethasone, Manual therapy, and Re-evaluation.   PLAN FOR NEXT SESSION: Aquatic #3, pt may have 1 land appt next week.?    JMyrene Galas PTA 07/25/22 3:08 PM   [facility] SurgerySpecial[facility] [address] [place] [place] [id number] # [phone] [phone]

## 2022-07-25 ENCOUNTER — Other Ambulatory Visit: Payer: Self-pay | Admitting: Family Medicine

## 2022-07-25 ENCOUNTER — Ambulatory Visit: Payer: Medicare Other | Attending: Orthopedic Surgery | Admitting: Physical Therapy

## 2022-07-25 ENCOUNTER — Encounter: Payer: Self-pay | Admitting: Physical Therapy

## 2022-07-25 DIAGNOSIS — M6281 Muscle weakness (generalized): Secondary | ICD-10-CM | POA: Insufficient documentation

## 2022-07-25 DIAGNOSIS — R252 Cramp and spasm: Secondary | ICD-10-CM | POA: Insufficient documentation

## 2022-07-25 DIAGNOSIS — M542 Cervicalgia: Secondary | ICD-10-CM | POA: Diagnosis not present

## 2022-07-25 DIAGNOSIS — M5459 Other low back pain: Secondary | ICD-10-CM | POA: Diagnosis not present

## 2022-07-25 DIAGNOSIS — Z1231 Encounter for screening mammogram for malignant neoplasm of breast: Secondary | ICD-10-CM

## 2022-08-01 ENCOUNTER — Ambulatory Visit: Payer: Medicare Other | Admitting: Physical Therapy

## 2022-08-04 ENCOUNTER — Encounter: Payer: Self-pay | Admitting: Rehabilitative and Restorative Service Providers"

## 2022-08-04 ENCOUNTER — Ambulatory Visit: Payer: Medicare Other | Admitting: Rehabilitative and Restorative Service Providers"

## 2022-08-04 DIAGNOSIS — M5459 Other low back pain: Secondary | ICD-10-CM | POA: Diagnosis not present

## 2022-08-04 DIAGNOSIS — M542 Cervicalgia: Secondary | ICD-10-CM | POA: Diagnosis not present

## 2022-08-04 DIAGNOSIS — M6281 Muscle weakness (generalized): Secondary | ICD-10-CM

## 2022-08-04 DIAGNOSIS — R252 Cramp and spasm: Secondary | ICD-10-CM | POA: Diagnosis not present

## 2022-08-04 NOTE — Therapy (Signed)
OUTPATIENT PHYSICAL THERAPY TREATMENT NOTE AND RE-ASSESSMENT   Patient Name: Kiara Wilson MRN: [id number] DOB:04/03/1961, 61 y.o., female Today's Date: 08/04/2022  PCP: Carol Ada, MD REFERRING PROVIDER: Melina Schools, MD  END OF SESSION:   PT End of Session - 08/04/22 1153     Visit Number 8    Date for PT Re-Evaluation 08/08/22    Authorization Type [employer]    Progress Note Due on Visit 10    PT Start Time 1152    PT Stop Time 1230    PT Time Calculation (min) 38 min    Activity Tolerance Patient tolerated treatment well    Behavior During Therapy WFL for tasks assessed/performed               Past Medical History:  Diagnosis Date   Allergy    Anxiety    Arthritis    Breast cancer ([place]) 09/2007   Depression    Depression with anxiety 01/08/2020   Essential hypertension 01/08/2020   Fibromyalgia    GERD (gastroesophageal reflux disease)    Hypertension    Migraine 01/08/2020   Migraines    Overactive bladder    Personal history of radiation therapy    Past Surgical History:  Procedure Laterality Date   BREAST BIOPSY Left    benign   BREAST LUMPECTOMY Right 2008   Browns   Patient Active Problem List   Diagnosis Date Noted   Post concussion syndrome 02/10/2022   Allergic rhinitis 02/06/2022   Amnesia 02/06/2022   Anxiety 02/06/2022   Asthma 02/06/2022   Constipation 02/06/2022   Decreased estrogen level 02/06/2022   Dysphagia 02/06/2022   Gastroesophageal reflux disease 02/06/2022   Hiatal hernia 02/06/2022   Insomnia 02/06/2022   Joint pain 02/06/2022   Major depression, single episode 02/06/2022   Personal history of malignant neoplasm of breast 02/06/2022   Recurrent major depression in full remission ([place]) 02/06/2022   Migraine without aura, not refractory 02/06/2022   Vitamin D deficiency 02/06/2022   Closed fracture of distal end of left radius 10/25/2020   Closed fracture of proximal end  of ulna 10/25/2020   Fibromyalgia 01/08/2020   Migraine 01/08/2020   Depression with anxiety 01/08/2020   Essential hypertension 01/08/2020   History of osteopenia 01/20/2018   Pain in right knee 01/20/2018   Breast cancer ([place]) 12/25/2011    REFERRING DIAG: M54.51 (ICD-10-CM) - Vertebrogenic low back pain   THERAPY DIAG:  Muscle weakness (generalized)  Other low back pain  Cramp and spasm  Cervicalgia  Rationale for Evaluation and Treatment Rehabilitation  PERTINENT HISTORY: HTN, breast cancer s/p right lumpectomy in 2008 with 5 lymph nodes removed, left wrist fracture November 2021  PRECAUTIONS: None  SUBJECTIVE: Pt reports relief of pain with pool, but states that she was at a church conference this weekend and had some increased pain as a result.  PAIN:  Are you having pain? Yes: NPRS scale: 3/10 Pain location:  lumbar spine Pain description: pinchy Aggravating factors: sitting for prolonged periods Relieving factors: lying down  PATIENT GOALS:  To decrease pain to be able to walk and exercise more and perform volunteer work.  OBJECTIVE: (objective measures completed at initial evaluation unless otherwise dated)  DIAGNOSTIC FINDINGS:  Pt reports MRI reveals cyst and mild bulging discs in lumbar spine.  States there were no herniations noted.   PATIENT SURVEYS:  08/04/2022:  FOTO 60% Modified Oswestry Low Back Pain Disability  Questionnaire: 11 / 50 = 22.0 %   06/12/2022: Modified Oswestry Low Back Pain Disability Questionnaire: 34 / 50 = 68.0 %  FOTO 44% at initial eval (predicted 53% by visit 12)    MUSCLE LENGTH: Hamstrings and Piriformis: tightness noted with left more impaired than right   POSTURE: rounded shoulders and forward head   PALPATION: Tightness and trigger points noted in upper traps, cervical and lumbar multifidi   CERVICAL ROM:   Active  A/PROM  eval A/ROM 08/04/2022  Flexion 40 75  Extension 35 75  Right lateral flexion 50 60   Left lateral flexion 35 60  Right rotation 60 90  Left rotation 50 70      LUMBAR ROM:    Active  A/ROM  eval A/ROM 07/02/22  Flexion Mid shin with pain Touching toes  Extension With pain "Very light pain"  Right lateral flexion 2 inches from knee 1 inch past knee  Left lateral flexion 3 inches from knee 1 inch past knee  Right rotation     Left rotation      (Blank rows = not tested)     LOWER EXTREMITY MMT:     08/04/2022: BLE strength is WFL  Eval: RLE strength of 4 to 4+/5 grossly throughout LLE strength of 4- to 4/5 grossly throughout   FUNCTIONAL TESTS:  5 times sit to stand: 15.6 sec with UE use and increased pain of 8/10 Timed up and go (TUG): 13.5 sec with increased pain of 8/10   GAIT: Distance walked: 200 ft Assistive device utilized: None Level of assistance: Complete Independence Comments: Antalgic gait pattern noted as pt progresses with ambulation       TODAY'S TREATMENT: 08/04/2022: Nustep level 6 x6 min with PT present to discuss status Seated on dynadisc:  4 way pelvic tilt x20 each Sit to/from stand holding 5# kettlebell:  x10 with chest press, x10 with overhead press Seated piriformis stretch 2x20 sec bilat Seated:  LAQ, marching, hip abduction scissors.  4# 2x10 bilat Rows 30# 2x10 Lat Pull 40# 2x10 Tricep Pressdown 20# 2x10  07/25/22:Pt arrives for aquatic physical therapy. Treatment took place in 3.5-5.5 feet of water. Water temperature was.91 degrees F. Pt entered the pool via steps reciprocally with minor use of rails. Pt requires buoyancy of water for support and to offload joints with strengthening exercises.   Seated water bench with 75% submersion Pt performed seated LE AROM exercises 20x in all planes, PTA reviewed current status . 75% submersion; water walking with small blue noodle for postural support 10x in each direction. VC to not walk on her tippie toes. Wall LE kicks 15x each direction BIL, VC to not go into too much hip  extension.  Post pelvic tilt against wall 5 sec hold 5x Standing at noodle high knee marching across pool 10x Thoracic rotations with yellow noodle: easy twists 1 min Horizontal decompression float with 100% flotation and PTA providing lateral sway to mobilize trunk.    07/18/22:Pt arrives for aquatic physical therapy. Treatment took place in 3.5-5.5 feet of water. Water temperature was.91 degrees F. Pt entered the pool via steps reciprocally with minor use of rails. Pt requires buoyancy of water for support and to offload joints with strengthening exercises.   Seated water bench with 75% submersion Pt performed seated LE AROM exercises 20x in all planes, PTA educated pt in water principles and how we would be using them. Pt verbally understood.  75% submersion; water walking with small blue noodle for  postural support 6x in each direction. VC to not walk on her tippie toes. Wall LE kicks 10x each direction BIL, VC to not go into too much hip extension.  Post pelvic tilt against wall 5 sec hold 5x Standing at noodle big marching 10x Bil Thoracic rotations with yellow noodle: easy twists 1 min Seated decompression with large noodle behind patient. PTA providing later sway for trunk mobilization. Intermittent LE AROM then full horizontal decompression float with 100% flotation and PTA providing lateral sway to mobilize trunk.       PATIENT EDUCATION:  Education details: Pt educated on HEP Person educated: Patient Education method: Consulting civil engineer, Media planner, and Handouts Education comprehension: verbalized understanding and returned demonstration     HOME EXERCISE PROGRAM: Access Code: [email or web address] URL: [email or web address] Date: 06/26/2022 Prepared by: Shelby Dubin Keyler Hoge  Exercises - Supine Lower Trunk Rotation  - 1 x daily - 7 x weekly - 1 sets - 5 reps - 5-10 sec hold - Supine Hamstring Stretch with Strap  - 1 x daily - 7 x weekly - 1 sets - 2 reps - 20 sec hold - Supine  Piriformis Stretch with Foot on Ground  - 1 x daily - 7 x weekly - 1 sets - 2 reps - 20 sec hold - Supine Single Knee to Chest Stretch  - 1 x daily - 7 x weekly - 1 sets - 5 reps - 5-10 sec hold - Dead Bug  - 1 x daily - 7 x weekly - 1 sets - 10 reps - Sidelying Thoracic Rotation with Open Book  - 1 x daily - 7 x weekly - 1 sets - 10 reps - Seated Cervical Retraction  - 1 x daily - 7 x weekly - 2 sets - 10 reps - Seated Upper Trapezius Stretch  - 1 x daily - 7 x weekly - 1 sets - 2 reps - 20 sec hold - Seated Scapular Retraction  - 1 x daily - 7 x weekly - 2 sets - 10 reps - 90/90 SI Joint Self-Correction  - 1 x daily - 7 x weekly - 2 sets - 10 reps - Hooklying Isometric Hip Abduction Adduction with Belt and Ball  - 1 x daily - 7 x weekly - 2 sets - 10 reps   ASSESSMENT:   CLINICAL IMPRESSION: Zyria arrives to skilled PT for reassessment visit.  Pt has made great improvements with goal related activities and she has met the goal for FOTO and ODI.  Pt continues to progress with ambulation to 40 minutes before pain starts increasing.  Pt reports that she is now able to drive for at least an hour before her pain starts increasing.  Pt has had 2 aquatic PT sessions and has had great relief of pain with these sessions and is going to continue her aquatic exercises at the [facility].  Pt has met all goals at this time and is ready for discharge from PT to continue with independent HEP.  OBJECTIVE IMPAIRMENTS difficulty walking, decreased ROM, decreased strength, increased muscle spasms, impaired flexibility, postural dysfunction, and pain.    ACTIVITY LIMITATIONS carrying, lifting, bending, sitting, standing, squatting, sleeping, and stairs   PARTICIPATION LIMITATIONS: cleaning, laundry, community activity, and yard work   PERSONAL FACTORS Time since onset of injury/illness/exacerbation and 1-2 comorbidities: HTN, Hx of breast cancer  are also affecting patient's functional outcome.    REHAB POTENTIAL:  Good   CLINICAL DECISION MAKING: Evolving/moderate complexity   EVALUATION COMPLEXITY: Moderate     GOALS:  Goals reviewed with patient? Yes   SHORT TERM GOALS: Target date: 07/03/2022   Pt will be independent with initial HEP. Baseline: Goal status: Goal Met   2.  Pt will report at least a 40% improvement in symptoms since starting PT. Baseline:  Goal status: Goal Met 07/02/2022     LONG TERM GOALS: Target date: 08/07/2022   Pt will be independent with advanced HEP for land and aquatics. Baseline:  Goal status: Goal Met 08/04/2022   2.  Lumbar FOTO to increase to at least 53% to indicate improvements in functional mobility. Baseline: 44% Goal status: Goal Met 08/04/2022   3.  Pt to report ability to return to leisurely walking routine of at least 40 minutes with pain no greater than 3/10. Baseline: On 07/09/22 pt reports that she can walk for 30 min before pain starts to increase Goal status: Ongoing   4.  Pt to increase BLE strength to at least 4+/5 to allow her to perform functional activities like lifting and stair negotiation. Baseline:  Goal status: Goal Met 08/04/2022   5.  Pt to improve modified Oswestry score to 50% or less to indicate less back pain with functional mobility. Baseline: 68% Goal status: Goal Met 08/04/2022   6.  Pt able to increase cervical A/ROM to allow drive for greater than 30 minutes without reports of increased lumbar or cervical pain. Baseline:  Goal status: Goal Met 08/04/2022     PLAN: PT FREQUENCY: 1-2x/week   PT DURATION: 8 weeks   PLANNED INTERVENTIONS: Therapeutic exercises, Therapeutic activity, Neuromuscular re-education, Balance training, Gait training, Patient/Family education, Joint manipulation, Joint mobilization, Stair training, Aquatic Therapy, Dry Needling, Electrical stimulation, Spinal manipulation, Spinal mobilization, Cryotherapy, Moist heat, Taping, Traction, Ultrasound, Ionotophoresis 57m/ml Dexamethasone, Manual  therapy, and Re-evaluation.   PLAN FOR NEXT SESSION: Discharge completed on 08/04/2022    SJuel Burrow PT 08/04/22 12:39 PM   [facility] CenterSpecial[facility] [address] SEastonGBelmont [place] [address]Phone # [phone] [phone]

## 2022-08-15 ENCOUNTER — Ambulatory Visit: Payer: Medicare Other | Admitting: Physical Therapy

## 2022-08-15 ENCOUNTER — Ambulatory Visit
Admission: RE | Admit: 2022-08-15 | Discharge: 2022-08-15 | Disposition: A | Discharge status: Home / Self Care | Payer: Medicare Other | Source: Ambulatory Visit | Attending: Family Medicine | Admitting: Family Medicine

## 2022-08-15 DIAGNOSIS — Z1231 Encounter for screening mammogram for malignant neoplasm of breast: Secondary | ICD-10-CM | POA: Diagnosis not present

## 2022-08-27 DIAGNOSIS — M545 Low back pain, unspecified: Secondary | ICD-10-CM | POA: Diagnosis not present

## 2022-08-27 DIAGNOSIS — I10 Essential (primary) hypertension: Secondary | ICD-10-CM | POA: Diagnosis not present

## 2022-08-27 DIAGNOSIS — K219 Gastro-esophageal reflux disease without esophagitis: Secondary | ICD-10-CM | POA: Diagnosis not present

## 2022-08-27 DIAGNOSIS — J45909 Unspecified asthma, uncomplicated: Secondary | ICD-10-CM | POA: Diagnosis not present

## 2022-08-27 DIAGNOSIS — G894 Chronic pain syndrome: Secondary | ICD-10-CM | POA: Diagnosis not present

## 2022-08-27 DIAGNOSIS — E559 Vitamin D deficiency, unspecified: Secondary | ICD-10-CM | POA: Diagnosis not present

## 2022-08-27 DIAGNOSIS — M797 Fibromyalgia: Secondary | ICD-10-CM | POA: Diagnosis not present

## 2022-08-27 DIAGNOSIS — J309 Allergic rhinitis, unspecified: Secondary | ICD-10-CM | POA: Diagnosis not present

## 2022-08-27 DIAGNOSIS — R7303 Prediabetes: Secondary | ICD-10-CM | POA: Diagnosis not present

## 2022-08-27 DIAGNOSIS — G4700 Insomnia, unspecified: Secondary | ICD-10-CM | POA: Diagnosis not present

## 2022-09-08 DIAGNOSIS — M6530 Trigger finger, unspecified finger: Secondary | ICD-10-CM | POA: Diagnosis not present

## 2022-09-08 DIAGNOSIS — J45909 Unspecified asthma, uncomplicated: Secondary | ICD-10-CM | POA: Diagnosis not present

## 2022-09-08 DIAGNOSIS — M1990 Unspecified osteoarthritis, unspecified site: Secondary | ICD-10-CM | POA: Diagnosis not present

## 2022-09-08 DIAGNOSIS — M25511 Pain in right shoulder: Secondary | ICD-10-CM | POA: Diagnosis not present

## 2022-09-08 DIAGNOSIS — M25559 Pain in unspecified hip: Secondary | ICD-10-CM | POA: Diagnosis not present

## 2022-09-08 DIAGNOSIS — M797 Fibromyalgia: Secondary | ICD-10-CM | POA: Diagnosis not present

## 2022-09-08 DIAGNOSIS — M353 Polymyalgia rheumatica: Secondary | ICD-10-CM | POA: Diagnosis not present

## 2022-09-08 DIAGNOSIS — R7309 Other abnormal glucose: Secondary | ICD-10-CM | POA: Diagnosis not present

## 2022-09-08 DIAGNOSIS — I10 Essential (primary) hypertension: Secondary | ICD-10-CM | POA: Diagnosis not present

## 2022-09-08 DIAGNOSIS — M0609 Rheumatoid arthritis without rheumatoid factor, multiple sites: Secondary | ICD-10-CM | POA: Diagnosis not present

## 2022-09-09 DIAGNOSIS — M461 Sacroiliitis, not elsewhere classified: Secondary | ICD-10-CM | POA: Diagnosis not present

## 2022-09-09 DIAGNOSIS — M47816 Spondylosis without myelopathy or radiculopathy, lumbar region: Secondary | ICD-10-CM | POA: Diagnosis not present

## 2022-09-09 DIAGNOSIS — M549 Dorsalgia, unspecified: Secondary | ICD-10-CM | POA: Diagnosis not present

## 2022-09-26 DIAGNOSIS — M461 Sacroiliitis, not elsewhere classified: Secondary | ICD-10-CM | POA: Diagnosis not present

## 2022-10-24 DIAGNOSIS — M461 Sacroiliitis, not elsewhere classified: Secondary | ICD-10-CM | POA: Diagnosis not present

## 2022-10-24 DIAGNOSIS — M549 Dorsalgia, unspecified: Secondary | ICD-10-CM | POA: Diagnosis not present

## 2022-10-24 DIAGNOSIS — M47816 Spondylosis without myelopathy or radiculopathy, lumbar region: Secondary | ICD-10-CM | POA: Diagnosis not present

## 2022-10-30 ENCOUNTER — Encounter: Payer: Self-pay | Admitting: Neurology

## 2022-10-30 DIAGNOSIS — M797 Fibromyalgia: Secondary | ICD-10-CM | POA: Diagnosis not present

## 2022-10-30 DIAGNOSIS — G894 Chronic pain syndrome: Secondary | ICD-10-CM | POA: Diagnosis not present

## 2022-11-05 ENCOUNTER — Encounter: Payer: Self-pay | Admitting: Neurology

## 2022-11-05 ENCOUNTER — Ambulatory Visit: Payer: Medicare Other | Admitting: Neurology

## 2022-11-05 VITALS — BP 121/79 | HR 101 | Ht 64.0 in | Wt 186.0 lb

## 2022-11-05 DIAGNOSIS — M7918 Myalgia, other site: Secondary | ICD-10-CM

## 2022-11-05 DIAGNOSIS — M542 Cervicalgia: Secondary | ICD-10-CM | POA: Insufficient documentation

## 2022-11-05 MED ORDER — RIZATRIPTAN BENZOATE 10 MG PO TBDP: 10.0000 mg | ORAL_TABLET | ORAL | 11 refills | Status: AC | PRN
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
## 2022-11-05 NOTE — Patient Instructions (Addendum)
Cervicalgia, cervical myofascial pain, occipital irritation: Kiara Wilson, dry needling helped a lot (headaches still coming from neck and shoulders  do not think migrainous) She decline PT for now Definitely follow up with Rheumatology for PMR(see below) could be causing symptoms

## 2022-11-06 DIAGNOSIS — M7918 Myalgia, other site: Secondary | ICD-10-CM | POA: Diagnosis not present

## 2022-11-06 DIAGNOSIS — M47816 Spondylosis without myelopathy or radiculopathy, lumbar region: Secondary | ICD-10-CM | POA: Diagnosis not present

## 2022-11-12 ENCOUNTER — Encounter: Payer: Self-pay | Admitting: Family Medicine

## 2022-12-10 ENCOUNTER — Encounter: Payer: Self-pay | Admitting: Neurology

## 2022-12-16 DIAGNOSIS — Z03818 Encounter for observation for suspected exposure to other biological agents ruled out: Secondary | ICD-10-CM | POA: Diagnosis not present

## 2022-12-16 DIAGNOSIS — J069 Acute upper respiratory infection, unspecified: Secondary | ICD-10-CM | POA: Diagnosis not present

## 2022-12-21 DIAGNOSIS — R5383 Other fatigue: Secondary | ICD-10-CM | POA: Diagnosis not present

## 2022-12-21 DIAGNOSIS — Z03818 Encounter for observation for suspected exposure to other biological agents ruled out: Secondary | ICD-10-CM | POA: Diagnosis not present

## 2022-12-21 DIAGNOSIS — R52 Pain, unspecified: Secondary | ICD-10-CM | POA: Diagnosis not present

## 2022-12-21 DIAGNOSIS — R051 Acute cough: Secondary | ICD-10-CM | POA: Diagnosis not present

## 2022-12-23 DIAGNOSIS — R110 Nausea: Secondary | ICD-10-CM | POA: Diagnosis not present

## 2022-12-23 DIAGNOSIS — R051 Acute cough: Secondary | ICD-10-CM | POA: Diagnosis not present

## 2022-12-23 DIAGNOSIS — D75839 Thrombocytosis, unspecified: Secondary | ICD-10-CM | POA: Diagnosis not present

## 2022-12-23 DIAGNOSIS — R0602 Shortness of breath: Secondary | ICD-10-CM | POA: Diagnosis not present

## 2022-12-29 DIAGNOSIS — K5901 Slow transit constipation: Secondary | ICD-10-CM | POA: Diagnosis not present

## 2022-12-29 DIAGNOSIS — R1013 Epigastric pain: Secondary | ICD-10-CM | POA: Diagnosis not present

## 2022-12-31 ENCOUNTER — Encounter (HOSPITAL_COMMUNITY): Payer: Self-pay

## 2022-12-31 ENCOUNTER — Emergency Department (HOSPITAL_COMMUNITY): Payer: Medicare Other

## 2022-12-31 ENCOUNTER — Emergency Department (HOSPITAL_COMMUNITY)
Admission: EM | Admit: 2022-12-31 | Discharge: 2022-12-31 | Disposition: A | Discharge status: Home / Self Care | Payer: Medicare Other | Attending: Student | Admitting: Student

## 2022-12-31 DIAGNOSIS — J45909 Unspecified asthma, uncomplicated: Secondary | ICD-10-CM | POA: Insufficient documentation

## 2022-12-31 DIAGNOSIS — M791 Myalgia, unspecified site: Secondary | ICD-10-CM | POA: Diagnosis not present

## 2022-12-31 DIAGNOSIS — Z743 Need for continuous supervision: Secondary | ICD-10-CM | POA: Diagnosis not present

## 2022-12-31 DIAGNOSIS — Z1152 Encounter for screening for COVID-19: Secondary | ICD-10-CM | POA: Insufficient documentation

## 2022-12-31 DIAGNOSIS — R0981 Nasal congestion: Secondary | ICD-10-CM | POA: Diagnosis not present

## 2022-12-31 DIAGNOSIS — Z7951 Long term (current) use of inhaled steroids: Secondary | ICD-10-CM | POA: Diagnosis not present

## 2022-12-31 DIAGNOSIS — R0602 Shortness of breath: Secondary | ICD-10-CM | POA: Diagnosis not present

## 2022-12-31 DIAGNOSIS — G4489 Other headache syndrome: Secondary | ICD-10-CM | POA: Diagnosis not present

## 2022-12-31 DIAGNOSIS — R6889 Other general symptoms and signs: Secondary | ICD-10-CM | POA: Diagnosis not present

## 2022-12-31 LAB — URINALYSIS, ROUTINE W REFLEX MICROSCOPIC
Bilirubin Urine: NEGATIVE
Glucose, UA: NEGATIVE mg/dL
Hgb urine dipstick: NEGATIVE
Ketones, ur: NEGATIVE mg/dL
Leukocytes,Ua: NEGATIVE
Nitrite: NEGATIVE
Protein, ur: NEGATIVE mg/dL
Specific Gravity, Urine: 1.002 — ABNORMAL LOW (ref 1.005–1.030)
pH: 8 (ref 5.0–8.0)

## 2022-12-31 LAB — CBC WITH DIFFERENTIAL/PLATELET
Abs Immature Granulocytes: 0.01 10*3/uL (ref 0.00–0.07)
Basophils Absolute: 0.1 10*3/uL (ref 0.0–0.1)
Basophils Relative: 1 %
Eosinophils Absolute: 0.1 10*3/uL (ref 0.0–0.5)
Eosinophils Relative: 1 %
HCT: 41.5 % (ref 36.0–46.0)
Hemoglobin: 13.7 g/dL (ref 12.0–15.0)
Immature Granulocytes: 0 %
Lymphocytes Relative: 24 %
Lymphs Abs: 1.4 10*3/uL (ref 0.7–4.0)
MCH: 29.3 pg (ref 26.0–34.0)
MCHC: 33 g/dL (ref 30.0–36.0)
MCV: 88.7 fL (ref 80.0–100.0)
Monocytes Absolute: 0.4 10*3/uL (ref 0.1–1.0)
Monocytes Relative: 6 %
Neutro Abs: 3.9 10*3/uL (ref 1.7–7.7)
Neutrophils Relative %: 68 %
Platelets: 400 10*3/uL (ref 150–400)
RBC: 4.68 MIL/uL (ref 3.87–5.11)
RDW: 13.2 % (ref 11.5–15.5)
WBC: 5.9 10*3/uL (ref 4.0–10.5)
nRBC: 0 % (ref 0.0–0.2)

## 2022-12-31 LAB — COMPREHENSIVE METABOLIC PANEL
ALT: 23 U/L (ref 0–44)
AST: 21 U/L (ref 15–41)
Albumin: 4.3 g/dL (ref 3.5–5.0)
Alkaline Phosphatase: 47 U/L (ref 38–126)
Anion gap: 7 (ref 5–15)
BUN: 9 mg/dL (ref 8–23)
CO2: 26 mmol/L (ref 22–32)
Calcium: 9.2 mg/dL (ref 8.9–10.3)
Chloride: 104 mmol/L (ref 98–111)
Creatinine, Ser: 0.63 mg/dL (ref 0.44–1.00)
GFR, Estimated: >60 mL/min (ref >60)
Glucose, Bld: 109 mg/dL — ABNORMAL HIGH (ref 70–99)
Potassium: 4 mmol/L (ref 3.5–5.1)
Sodium: 137 mmol/L (ref 135–145)
Total Bilirubin: 0.9 mg/dL (ref 0.3–1.2)
Total Protein: 7.6 g/dL (ref 6.5–8.1)

## 2022-12-31 LAB — RESP PANEL BY RT-PCR (RSV, FLU A&B, COVID)  RVPGX2
Influenza A by PCR: NEGATIVE
Influenza B by PCR: NEGATIVE
Resp Syncytial Virus by PCR: NEGATIVE
SARS Coronavirus 2 by RT PCR: NEGATIVE

## 2022-12-31 MED ORDER — ONDANSETRON HCL 4 MG/2ML IJ SOLN
4.0000 mg | Freq: Once | INTRAMUSCULAR | Status: AC
Start: 2022-12-31 — End: 2022-12-31
  Administered 2022-12-31: 4 mg via INTRAVENOUS
  Filled 2022-12-31: qty 2

## 2022-12-31 MED ORDER — KETOROLAC TROMETHAMINE 15 MG/ML IJ SOLN
15.0000 mg | Freq: Once | INTRAMUSCULAR | Status: AC
  Administered 2022-12-31: 15 mg via INTRAVENOUS
  Filled 2022-12-31: qty 1

## 2022-12-31 MED ORDER — SODIUM CHLORIDE 0.9 % IV BOLUS
1000.0000 mL | Freq: Once | INTRAVENOUS | Status: AC
  Administered 2022-12-31: 1000 mL via INTRAVENOUS

## 2022-12-31 MED ORDER — LORAZEPAM 0.5 MG PO TABS
0.5000 mg | ORAL_TABLET | Freq: Once | ORAL | Status: AC
  Administered 2022-12-31: 0.5 mg via ORAL
  Filled 2022-12-31: qty 1

## 2022-12-31 NOTE — Discharge Instructions (Addendum)
Please read and follow all provided instructions.  Your diagnoses today include:  1. Shortness of breath   2. Myalgia     Tests performed today include: Complete blood cell count:  Basic metabolic panel:  Urinalysis (urine test): no UTI EKG: No problems Chest x-ray: No signs of pneumonia COVID, flu, RSV: Remains negative Vital signs. See below for your results today.   Medications prescribed:  Zofran (ondansetron) - for nausea and vomiting  Take any prescribed medications only as directed.  Home care instructions:  Follow any educational materials contained in this packet.  BE VERY CAREFUL not to take multiple medicines containing Tylenol (also called acetaminophen). Doing so can lead to an overdose which can damage your liver and cause liver failure and possibly death.   Follow-up instructions: Please follow-up with your primary care provider in the next 3 days for further evaluation of your symptoms.   Return instructions:  Please return to the Emergency Department if you experience worsening symptoms.  Please return if you have any other emergent concerns.  Additional Information:  Your vital signs today were: BP (!) 154/100 (BP Location: Right Arm)   Pulse 94   Temp 98 F (36.7 C) (Oral)   Resp 16   LMP 08/08/2012   SpO2 99%  If your blood pressure (BP) was elevated above 135/85 this visit, please have this repeated by your doctor within one month. --------------

## 2022-12-31 NOTE — ED Provider Notes (Addendum)
[facility] DEPT Provider Note   CSN: [id number] Arrival date & time: 12/31/22  1004     History  Chief Complaint  Patient presents with   Nasal Congestion    Kiara Wilson is a 62 y.o. female.  Patient with history of asthma, history of anxiety and depression-- presents to the emergency department today today for evaluation of not feeling well for the past 3 weeks. Patient reports having sinusitis type symptoms with shortness of breath.  She states that she was initially seen by PCP and placed on antibiotic for a upper respiratory infection.  She does not remember the name of the antibiotic.  She states that she has had increasing body pain consistent with her fibromyalgia.  She has also had recurrent headaches.  Her symptoms did not initially feel better, prompting revisit to the walk-in clinic.  She states that at that time she was tested for flu, COVID and RSV which were negative.  Her antibiotic was changed to doxycycline.  She had a negative x-ray.  She has continued to feel poorly.  Her doctor saw her after she developed GI upset with nausea.  No associated vomiting.  She has had constipation and diarrhea at times.  She states that she called her doctor today and they recommended that she go to the emergency department.  She states that she did not want to go to the emergency department because of the wait, but she called EMS for transport.       Home Medications Prior to Admission medications   Medication Sig Start Date End Date Taking? Authorizing Provider  acetaminophen (TYLENOL) 500 MG tablet Take 500 mg by mouth 2 (two) times daily.    [provider]  APPLE CIDER VINEGAR PO Take 2 each by mouth daily.    [provider]  cetirizine (ZYRTEC) 10 MG tablet Take 10 mg by mouth daily.    [provider]  Cholecalciferol (VITAMIN D3 PO) Take 5,000 Int'l Units by mouth daily.    [provider]   cyclobenzaprine (FLEXERIL) 10 MG tablet Take 1 tablet (10 mg total) by mouth at bedtime. Patient not taking: Reported on 11/05/2022 02/10/22   Melvenia Beam, MD  DULoxetine (CYMBALTA) 30 MG capsule Take 30 mg by mouth daily. Patient not taking: Reported on 11/05/2022 11/26/21   [provider]  fluticasone (FLONASE) 50 MCG/ACT nasal spray Place into both nostrils. 01/08/22   [provider]  losartan (COZAAR) 50 MG tablet Take 50 mg by mouth daily.      [provider]  Omega-3 1000 MG CAPS Take 1 capsule by mouth daily.    [provider]  OVER THE COUNTER MEDICATION Take 540 mg by mouth daily. Black cohash po daily    [provider]  rizatriptan (MAXALT-MLT) 10 MG disintegrating tablet Take 1 tablet (10 mg total) by mouth as needed for migraine. May repeat in 2 hours if needed. (Max 2/24hours) only 2-3 x week. 05/15/22   Melvenia Beam, MD  rizatriptan (MAXALT-MLT) 10 MG disintegrating tablet Take 1 tablet (10 mg total) by mouth as needed for migraine. May repeat in 2 hours if needed 11/05/22   Melvenia Beam, MD  traMADol (ULTRAM) 50 MG tablet Take 50 mg by mouth as needed.    [provider]  venlafaxine XR (EFFEXOR-XR) 75 MG 24 hr capsule Take 75 mg by mouth daily. 10/30/22   [provider]  vitamin E 180 MG (400 UNITS) capsule  Take 400 Units by mouth daily.    [provider]      Allergies    Amoxicillin, Bupropion, Duloxetine hcl, Fexofenadine, Loratadine, Naproxen, Pregabalin, Trazodone hcl, Verapamil, and Adhesive [tape]    Review of Systems   Review of Systems  Physical Exam Updated Vital Signs BP (!) 154/100 (BP Location: Right Arm)   Pulse 94   Temp 98 F (36.7 C) (Oral)   Resp 16   LMP 08/08/2012   SpO2 99%   Physical Exam Vitals and nursing note reviewed.  Constitutional:      General: She is not in acute distress.    Appearance: She is well-developed.  HENT:     Head: Normocephalic and  atraumatic.     Right Ear: External ear normal.     Left Ear: External ear normal.     Nose: Nose normal.  Eyes:     Conjunctiva/sclera: Conjunctivae normal.  Cardiovascular:     Rate and Rhythm: Normal rate and regular rhythm.     Heart sounds: No murmur heard. Pulmonary:     Effort: No respiratory distress.     Breath sounds: No wheezing, rhonchi or rales.  Abdominal:     Palpations: Abdomen is soft.     Tenderness: There is no abdominal tenderness. There is no guarding or rebound.  Musculoskeletal:     Cervical back: Normal range of motion and neck supple.     Right lower leg: No edema.     Left lower leg: No edema.  Skin:    General: Skin is warm and dry.     Findings: No rash.  Neurological:     General: No focal deficit present.     Mental Status: She is alert. Mental status is at baseline.     Motor: No weakness.  Psychiatric:        Attention and Perception: Attention normal.        Mood and Affect: Mood is anxious. Affect is tearful.        Speech: Speech normal.        Behavior: Behavior normal.        Thought Content: Thought content normal.     ED Results / Procedures / Treatments   Labs (all labs ordered are listed, but only abnormal results are displayed) Labs Reviewed  COMPREHENSIVE METABOLIC PANEL - Abnormal; Notable for the following components:      Result Value   Glucose, Bld 109 (*)    All other components within normal limits  URINALYSIS, ROUTINE W REFLEX MICROSCOPIC - Abnormal; Notable for the following components:   Color, Urine COLORLESS (*)    Specific Gravity, Urine 1.002 (*)    All other components within normal limits  RESP PANEL BY RT-PCR (RSV, FLU A&B, COVID)  [id number]  CBC WITH DIFFERENTIAL/PLATELET    ED ECG REPORT   Date: 12/31/2022  Rate: 70  Rhythm: normal sinus rhythm  QRS Axis: normal  Intervals: PR shortened  ST/T Wave abnormalities: normal  Conduction Disutrbances:1st degree HB  Narrative Interpretation:   Old EKG  Reviewed: unchanged except PR shorter today  I have personally reviewed the EKG tracing and agree with the computerized printout as noted.   Radiology No results found.  Procedures Procedures    Medications Ordered in ED Medications  sodium chloride 0.9 % bolus 1,000 mL (0 mLs Intravenous Stopped 12/31/22 1300)  ondansetron (ZOFRAN) injection 4 mg (4 mg Intravenous Given 12/31/22 1139)  ketorolac (TORADOL) 15 MG/ML injection 15 mg (15  mg Intravenous Given 12/31/22 1139)  LORazepam (ATIVAN) tablet 0.5 mg (0.5 mg Oral Given 12/31/22 1200)    ED Course/ Medical Decision Making/ A&P    Patient seen and examined. History obtained directly from patient. Work-up including labs, imaging, EKG ordered in triage, if performed, were reviewed.    Labs/EKG: Independently reviewed and interpreted.  This included: CBC, CMP, viral respiratory panel.  Imaging: Independently visualized and interpreted.  This included: Chest x-ray  Medications/Fluids: Ordered: IV fluid bolus, IV Zofran, IV Toradol, p.o. Ativan   Most recent vital signs reviewed and are as follows: BP (!) 154/100 (BP Location: Right Arm)   Pulse 94   Temp 98 F (36.7 C) (Oral)   Resp 16   LMP 08/08/2012   SpO2 99%   Initial impression: Patient is very tearful during exam.  She states that she is feeling very poorly despite overall reassuring exam.  Will recheck labs, x-ray.  Will give symptomatic control and reassess.  1:33 PM Reassessment performed. Patient appears stable.  She states that she is feeling much better.  The lorazepam helped her relax.  Labs personally reviewed and interpreted including: CBC unremarkable; CMP unremarkable; respiratory panel negative; UA without signs of infection.  EKG reviewed as above.  Imaging personally visualized and interpreted including: Chest x-ray agree negative.  Reviewed pertinent lab work and imaging with patient at bedside. Questions answered.   Most current vital signs reviewed  and are as follows: BP (!) 146/93 (BP Location: Left Arm)   Pulse 95   Temp 98 F (36.7 C) (Oral)   Resp 16   LMP 08/08/2012   SpO2 99%   Plan: Discharge to home.   Prescriptions written for: Considered zofran however patient states she already has an rx ready for pickup.   Other home care instructions discussed: Rest, hydration  ED return instructions discussed: Worsening shortness of breath, increased work of breathing, persistent vomiting, fevers, new symptoms or other concerns.  Follow-up instructions discussed: Patient encouraged to follow-up with their PCP in 3-5 days.                            Medical Decision Making Amount and/or Complexity of Data Reviewed Labs: ordered. Radiology: ordered.  Risk Prescription drug management.   Patient with shortness of breath and myalgias.  Recent treatment for respiratory infection with antibiotics.  This was discontinued after GI symptoms.  Recheck labs today as well as x-ray all of which are reassuring.  Patient did seem very anxious on arrival but has done well with symptomatic treatment including IV fluids, Toradol, and lorazepam.  Currently she is improved and ready to go home.  Low concern for sepsis, ACS, PE.  The patient's vital signs, pertinent lab work and imaging were reviewed and interpreted as discussed in the ED course. Hospitalization was considered for further testing, treatments, or serial exams/observation. However as patient is well-appearing, has a stable exam, and reassuring studies today, I do not feel that they warrant admission at this time. This plan was discussed with the patient who verbalizes agreement and comfort with this plan and seems reliable and able to return to the Emergency Department with worsening or changing symptoms.          Final Clinical Impression(s) / ED Diagnoses Final diagnoses:  Shortness of breath  Myalgia    Rx / DC Orders ED Discharge Orders     None  Carlisle Cater, PA-C 12/31/22 1336    Carlisle Cater, PA-C 12/31/22 [place], MD 12/31/22 2131

## 2022-12-31 NOTE — ED Triage Notes (Signed)
Pt presents via EMS from home with c/o nasal congestion. Pt went to UC on 12/20 for nasal congestion and some respiratory complaints. Pt had an x-ray and an antibiotic but her symptoms did not get better. Pt went to her PCP this week and was told to DC the antibiotics. Pt still is not feeling well. Lungs clear per EMS. Pt just reports she does not feel well.

## 2023-01-09 ENCOUNTER — Emergency Department (HOSPITAL_COMMUNITY)
Admission: EM | Admit: 2023-01-09 | Discharge: 2023-01-10 | Discharge status: Against Medical Advice | Payer: Medicare Other | Attending: Emergency Medicine | Admitting: Emergency Medicine

## 2023-01-09 ENCOUNTER — Other Ambulatory Visit: Payer: Self-pay

## 2023-01-09 ENCOUNTER — Emergency Department (HOSPITAL_COMMUNITY): Payer: Medicare Other

## 2023-01-09 ENCOUNTER — Encounter (HOSPITAL_COMMUNITY): Payer: Self-pay

## 2023-01-09 DIAGNOSIS — R110 Nausea: Secondary | ICD-10-CM | POA: Diagnosis not present

## 2023-01-09 DIAGNOSIS — Z5321 Procedure and treatment not carried out due to patient leaving prior to being seen by health care provider: Secondary | ICD-10-CM | POA: Diagnosis not present

## 2023-01-09 DIAGNOSIS — R509 Fever, unspecified: Secondary | ICD-10-CM | POA: Diagnosis not present

## 2023-01-09 DIAGNOSIS — R059 Cough, unspecified: Secondary | ICD-10-CM | POA: Diagnosis not present

## 2023-01-09 DIAGNOSIS — R1110 Vomiting, unspecified: Secondary | ICD-10-CM | POA: Diagnosis not present

## 2023-01-09 DIAGNOSIS — R519 Headache, unspecified: Secondary | ICD-10-CM | POA: Diagnosis not present

## 2023-01-09 DIAGNOSIS — Z743 Need for continuous supervision: Secondary | ICD-10-CM | POA: Diagnosis not present

## 2023-01-09 DIAGNOSIS — J101 Influenza due to other identified influenza virus with other respiratory manifestations: Secondary | ICD-10-CM | POA: Insufficient documentation

## 2023-01-09 DIAGNOSIS — Z1152 Encounter for screening for COVID-19: Secondary | ICD-10-CM | POA: Insufficient documentation

## 2023-01-09 DIAGNOSIS — G4489 Other headache syndrome: Secondary | ICD-10-CM | POA: Diagnosis not present

## 2023-01-09 DIAGNOSIS — I499 Cardiac arrhythmia, unspecified: Secondary | ICD-10-CM | POA: Diagnosis not present

## 2023-01-09 NOTE — ED Triage Notes (Signed)
Pt BIB EMS with reports of cough, congestion, fever, headache, and vomiting since the 21 st of December. Pt was tx for PNA in December but states that it got worse. Pt states that she used her inhaler 12 x today. Lung sounds clear.

## 2023-01-10 DIAGNOSIS — R0602 Shortness of breath: Secondary | ICD-10-CM | POA: Diagnosis not present

## 2023-01-10 LAB — RESP PANEL BY RT-PCR (RSV, FLU A&B, COVID)  RVPGX2
Influenza A by PCR: POSITIVE — AB
Influenza B by PCR: NEGATIVE
Resp Syncytial Virus by PCR: NEGATIVE
SARS Coronavirus 2 by RT PCR: NEGATIVE

## 2023-01-10 MED ORDER — ACETAMINOPHEN 325 MG PO TABS
650.0000 mg | ORAL_TABLET | Freq: Once | ORAL | Status: AC
Start: 2023-01-10 — End: 2023-01-10
  Administered 2023-01-10: 650 mg via ORAL
  Filled 2023-01-10: qty 2

## 2023-01-12 DIAGNOSIS — J029 Acute pharyngitis, unspecified: Secondary | ICD-10-CM | POA: Diagnosis not present

## 2023-01-14 IMAGING — CT CT HEAD W/O CM
4 series · 17 of 47 positions shown, 19 images · non-contrast
Comparison: Brain MRI 02/21/2020.

CLINICAL DATA: Provided history: New onset headache.

EXAM:
CT HEAD WITHOUT CONTRAST
TECHNIQUE: Contiguous axial images were obtained from the base of the skull
through the vertex without intravenous contrast.

[Series 2: head wo · axial · 0.40mm/px · z∈[+1183,+1303]mm · 7 of 33 slices shown, 9 images]
[im 5/33  brain]
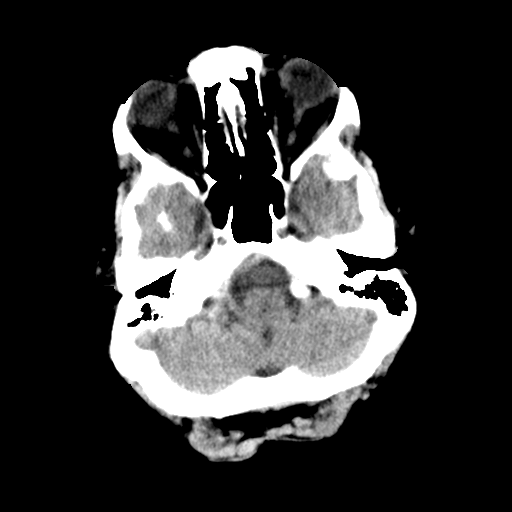
[im 5/33  bone]
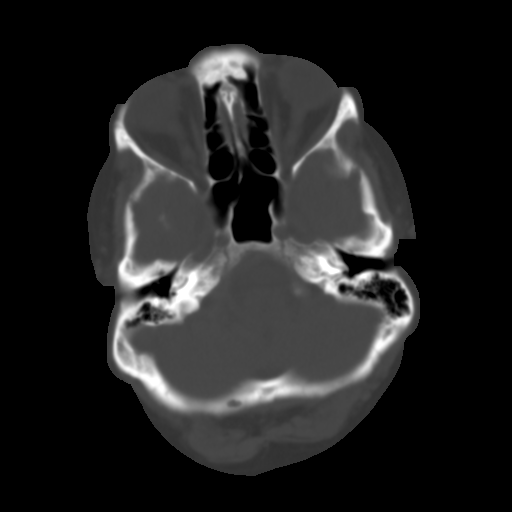
[im 9/33  brain]
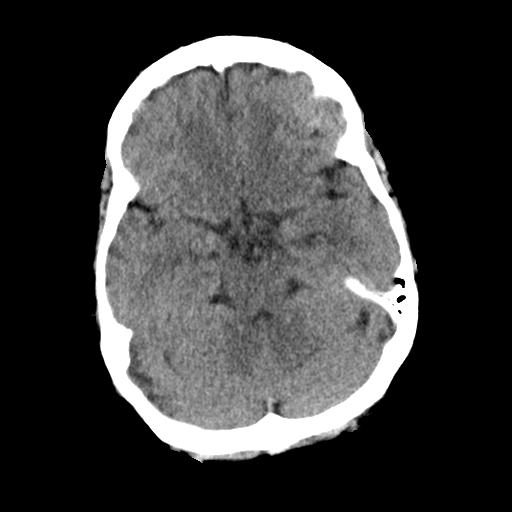
[im 13/33  brain]
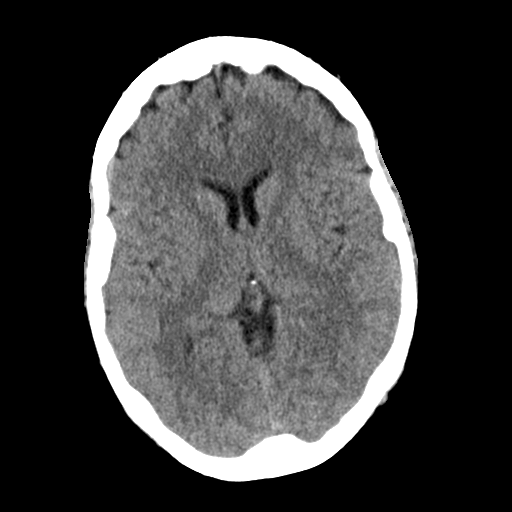
[im 17/33  brain]
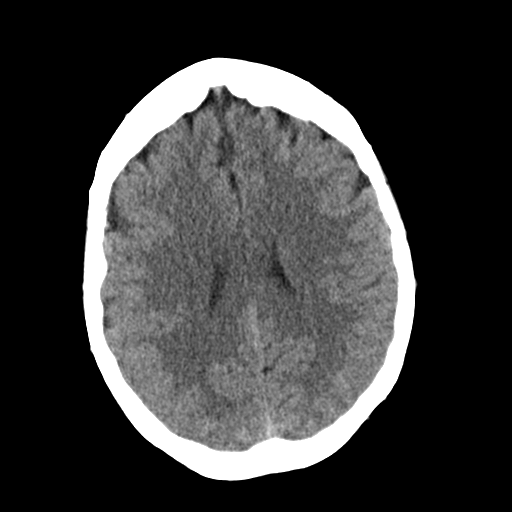
[im 21/33  brain]
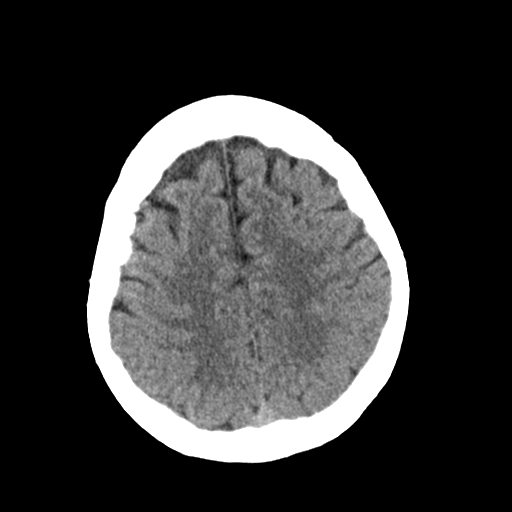
[im 21/33  bone]
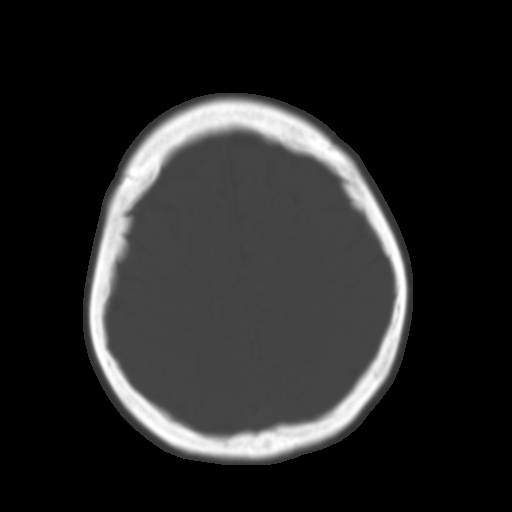
[im 25/33  brain]
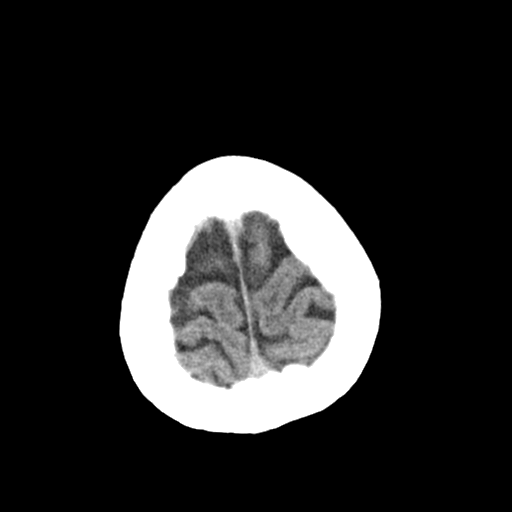
[im 29/33  brain]
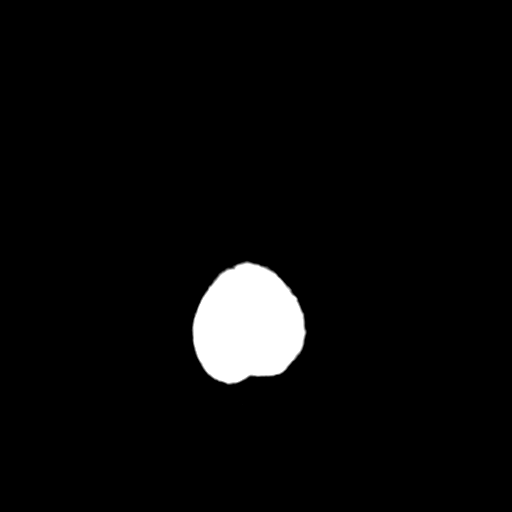

[Series 3: head bone · axial · 0.40mm/px · z∈[+1179,+1235]mm · 4 of 81 slices shown]
[im 9/81  bone]
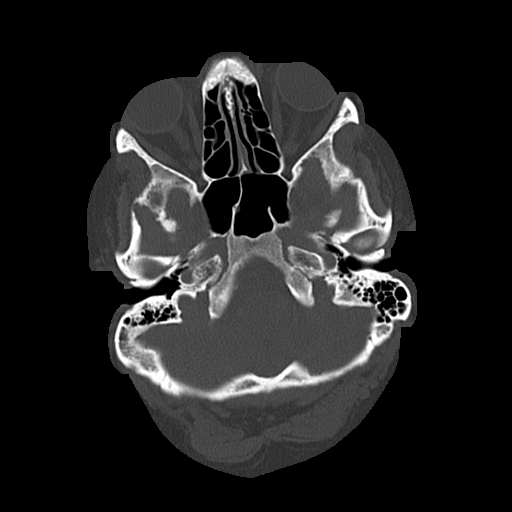
[im 17/81  bone]
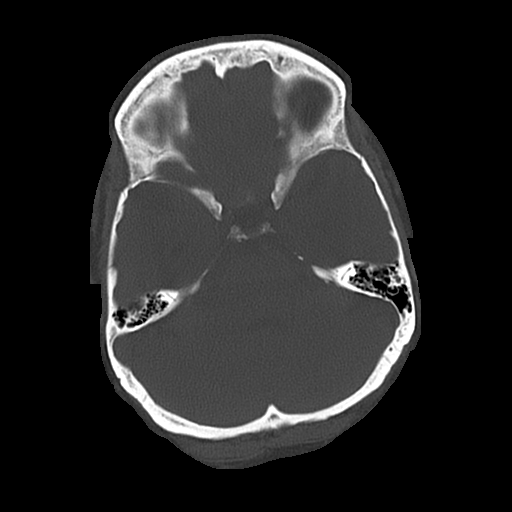
[im 25/81  bone]
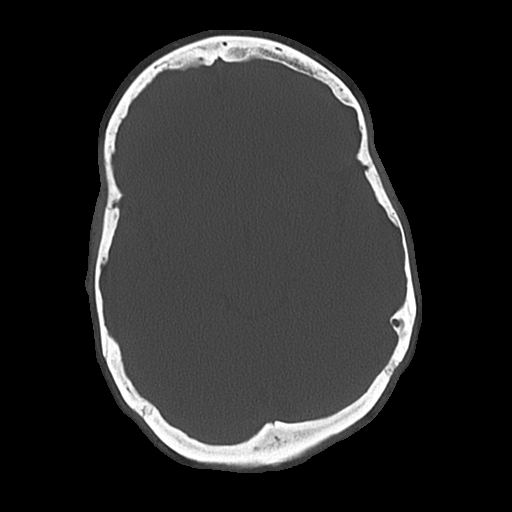
[im 37/81  bone]
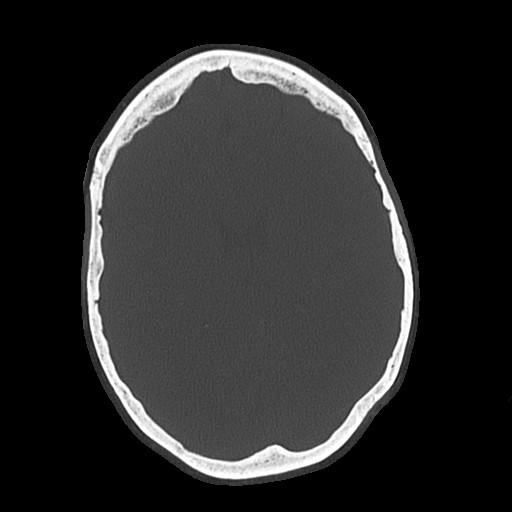

[Series 4: coronal soft · coronal · 0.31mm/px · 3 of 63 slices shown]
[im 21/63  brain]
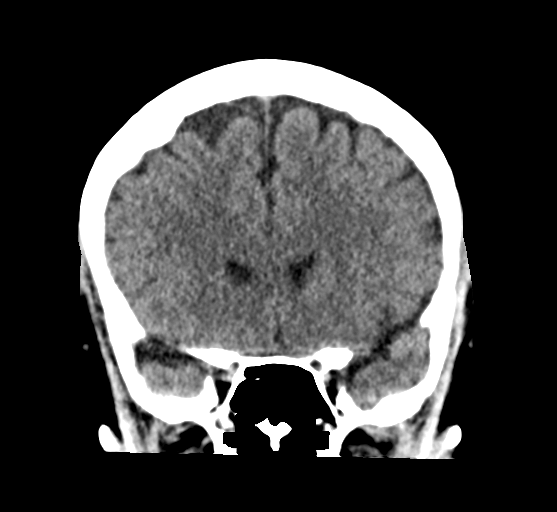
[im 28/63  brain]
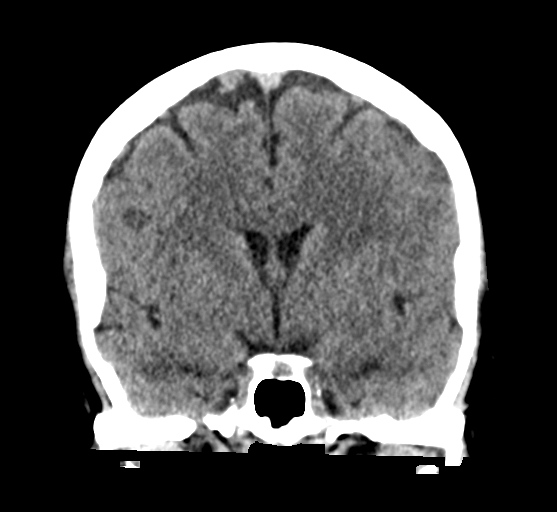
[im 35/63  brain]
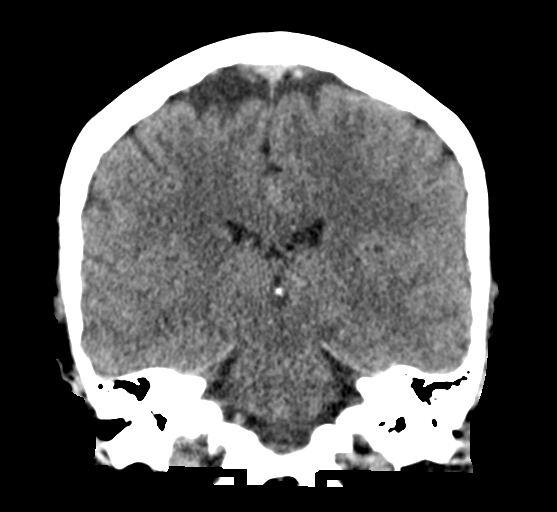

[Series 5: sagittal soft · sagittal · 0.31mm/px · 3 of 50 slices shown]
[im 17/50  brain]
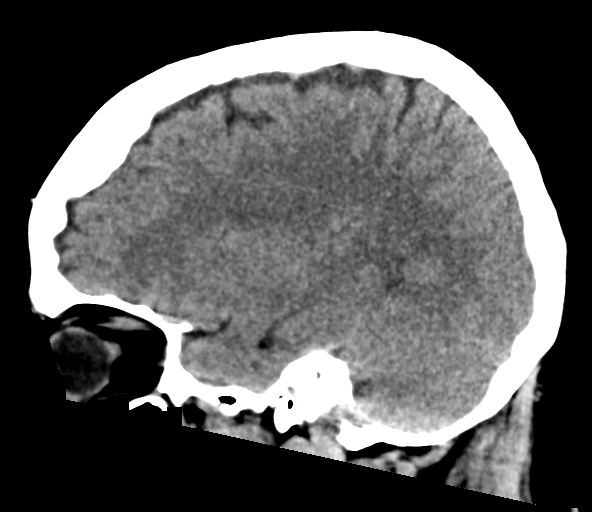
[im 25/50  brain]
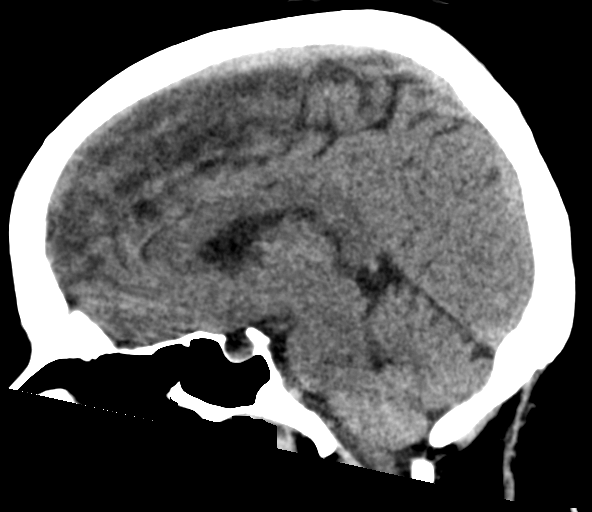
[im 33/50  brain]
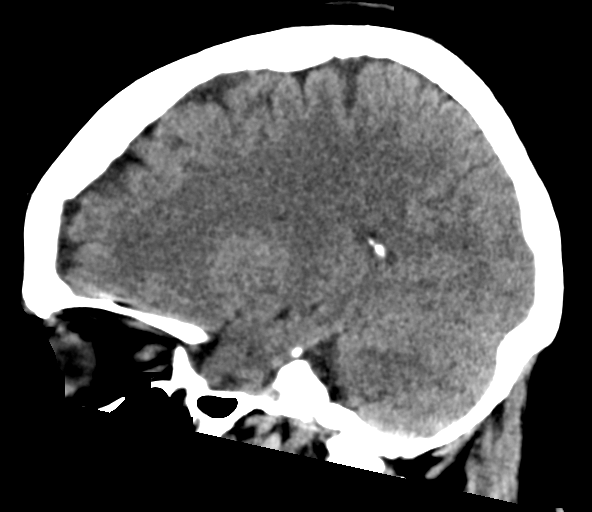

[17 of 47 positions shown; findings below may reference images not displayed]

FINDINGS: Brain:

Cerebral volume is normal.

Mild nonspecific chronic cerebral white matter disease was better
appreciated on the prior brain MRI of 02/21/2020.

There is no acute intracranial hemorrhage.

No demarcated cortical infarct.

No extra-axial fluid collection.

No evidence of an intracranial mass.

No midline shift.

Vascular: No hyperdense vessel.

Skull: Normal. Negative for fracture or focal lesion.

Sinuses/Orbits: Visualized orbits show no acute finding. No
significant paranasal sinus disease at the imaged levels.
IMPRESSION: No evidence of acute intracranial abnormality.

Mild nonspecific chronic cerebral white matter disease was better
appreciated on the prior brain MRI of 02/21/2020.

## 2023-01-15 DIAGNOSIS — R0602 Shortness of breath: Secondary | ICD-10-CM | POA: Diagnosis not present

## 2023-01-15 DIAGNOSIS — J22 Unspecified acute lower respiratory infection: Secondary | ICD-10-CM | POA: Diagnosis not present

## 2023-01-18 ENCOUNTER — Encounter (HOSPITAL_COMMUNITY): Payer: Self-pay | Admitting: Emergency Medicine

## 2023-01-18 ENCOUNTER — Emergency Department (HOSPITAL_COMMUNITY): Payer: Medicare Other

## 2023-01-18 ENCOUNTER — Emergency Department (HOSPITAL_COMMUNITY)
Admission: EM | Admit: 2023-01-18 | Discharge: 2023-01-18 | Disposition: A | Discharge status: Home / Self Care | Payer: Medicare Other | Attending: Emergency Medicine | Admitting: Emergency Medicine

## 2023-01-18 ENCOUNTER — Other Ambulatory Visit: Payer: Self-pay

## 2023-01-18 DIAGNOSIS — R052 Subacute cough: Secondary | ICD-10-CM

## 2023-01-18 DIAGNOSIS — R0602 Shortness of breath: Secondary | ICD-10-CM | POA: Diagnosis not present

## 2023-01-18 DIAGNOSIS — R634 Abnormal weight loss: Secondary | ICD-10-CM | POA: Diagnosis not present

## 2023-01-18 DIAGNOSIS — R051 Acute cough: Secondary | ICD-10-CM | POA: Insufficient documentation

## 2023-01-18 DIAGNOSIS — J45990 Exercise induced bronchospasm: Secondary | ICD-10-CM | POA: Insufficient documentation

## 2023-01-18 DIAGNOSIS — R0789 Other chest pain: Secondary | ICD-10-CM | POA: Diagnosis not present

## 2023-01-18 DIAGNOSIS — R059 Cough, unspecified: Secondary | ICD-10-CM | POA: Diagnosis present

## 2023-01-18 LAB — BASIC METABOLIC PANEL
Anion gap: 10 (ref 5–15)
BUN: 10 mg/dL (ref 8–23)
CO2: 24 mmol/L (ref 22–32)
Calcium: 9.6 mg/dL (ref 8.9–10.3)
Chloride: 100 mmol/L (ref 98–111)
Creatinine, Ser: 0.74 mg/dL (ref 0.44–1.00)
GFR, Estimated: >60 mL/min (ref >60)
Glucose, Bld: 99 mg/dL (ref 70–99)
Potassium: 4.3 mmol/L (ref 3.5–5.1)
Sodium: 134 mmol/L — ABNORMAL LOW (ref 135–145)

## 2023-01-18 LAB — TROPONIN I (HIGH SENSITIVITY)
Troponin I (High Sensitivity): 4 ng/L (ref <18)
Troponin I (High Sensitivity): 4 ng/L (ref <18)

## 2023-01-18 LAB — CBC
HCT: 41.9 % (ref 36.0–46.0)
Hemoglobin: 13.9 g/dL (ref 12.0–15.0)
MCH: 29.4 pg (ref 26.0–34.0)
MCHC: 33.2 g/dL (ref 30.0–36.0)
MCV: 88.8 fL (ref 80.0–100.0)
Platelets: 474 10*3/uL — ABNORMAL HIGH (ref 150–400)
RBC: 4.72 MIL/uL (ref 3.87–5.11)
RDW: 13.3 % (ref 11.5–15.5)
WBC: 8.4 10*3/uL (ref 4.0–10.5)
nRBC: 0 % (ref 0.0–0.2)

## 2023-01-18 MED ORDER — LACTATED RINGERS IV BOLUS
500.0000 mL | Freq: Once | INTRAVENOUS | Status: AC
  Administered 2023-01-18: 500 mL via INTRAVENOUS

## 2023-01-18 MED ORDER — IOHEXOL 350 MG/ML SOLN
75.0000 mL | Freq: Once | INTRAVENOUS | Status: AC | PRN
  Administered 2023-01-18: 75 mL via INTRAVENOUS

## 2023-01-18 MED ORDER — SODIUM CHLORIDE 0.9 % IV SOLN
6.2500 mg | Freq: Four times a day (QID) | INTRAVENOUS | Status: DC | PRN
  Administered 2023-01-18: 6.25 mg via INTRAVENOUS
  Filled 2023-01-18: qty 0.25

## 2023-01-18 NOTE — ED Notes (Signed)
Pt reports nausea improved.

## 2023-01-18 NOTE — ED Notes (Signed)
Pt ambulated to restroom without assistance, NAD noted at this time

## 2023-01-18 NOTE — Discharge Instructions (Signed)
You were seen in the emergency department today for your cough.  While in the emergency department completely full history and physical exam.  Based off of our findings we feel that you are suffering from an acute subacute cough.  Please continue taking your Symbicort 2 times daily.  We placed an ambulatory referral for our lung doctors to reach out to you.  I placed their number in your chart.  Please feel free to return to the emergency department anytime if you feel that you are having difficulty breathing, coughing up blood, having coughing that leads to syncope or you feel that you have any other emergent condition.  Sincerely, Clydie Braun, PGY-2

## 2023-01-18 NOTE — ED Notes (Signed)
Patient transported to CT 

## 2023-01-18 NOTE — ED Notes (Signed)
IV team at bedside 

## 2023-01-18 NOTE — ED Notes (Signed)
RN called CT to let them know IV has been placed

## 2023-01-18 NOTE — ED Triage Notes (Addendum)
Pt sent from PCP for further evaluation of SOB. Pt states she has been sick on and off since December. PT test positive for flu over a week ago and states feels worse now. PT complains of chest pressure and back pain worse today

## 2023-01-18 NOTE — ED Provider Triage Note (Signed)
Emergency Medicine Provider Triage Evaluation Note  Kiara Wilson , a 62 y.o. female  was evaluated in triage.  Pt complains of shortness of breath  Review of Systems  Positive: cough and weakness  Negative:   Physical Exam  BP (!) 139/93 (BP Location: Left Arm)   Pulse (!) 106   Temp 98.8 F (37.1 C)   Resp 16   Wt 79.8 kg   LMP 08/08/2012   SpO2 95%   BMI 30.21 kg/m  Gen:   Awake, no distress   Resp:  Normal effort  MSK:   Moves extremities without difficulty  Other:    Medical Decision Making  Medically screening exam initiated at 2:09 PM.  Appropriate orders placed.  Kiara Wilson was informed that the remainder of the evaluation will be completed by another provider, this initial triage assessment does not replace that evaluation, and the importance of remaining in the ED until their evaluation is complete.  Pt refuses chest xray Pt reports she has had 2 previous xray    Kiara Wilson, [place] 01/18/23 1411

## 2023-01-18 NOTE — ED Provider Notes (Signed)
[facility] Provider Note   CSN: [id number] Arrival date & time: 01/18/23  1155     History  Chief Complaint  Patient presents with   Shortness of Breath    Dineen Conradt is a 62 y.o. female.  Haly Micha Erck is a 62 year old female past medical history of fibromyalgia, exercise-induced asthma, anxiety depression presenting to the emergency department for persistent cough.  Patient states that [place] and has had persistent upper respiratory symptoms since that time.  She has completed a course of doxycycline and clindamycin patient recently was diagnosed with flu B on January 20th and completed a course of Tamiflu as well as doxycycline.  She states that her cough has been persistent and is worse with activity she states that is making her feel short of breath when she ambulates she denies true chest pain states she has been eating and drinking as usual and has been having normal bathroom habits she denies any fevers or chills she denies any productive sputum.    Shortness of Breath      Home Medications Prior to Admission medications   Medication Sig Start Date End Date Taking? Authorizing Provider  acetaminophen (TYLENOL) 500 MG tablet Take 500 mg by mouth 2 (two) times daily.    [provider]  APPLE CIDER VINEGAR PO Take 2 each by mouth daily.    [provider]  cetirizine (ZYRTEC) 10 MG tablet Take 10 mg by mouth daily.    [provider]  Cholecalciferol (VITAMIN D3 PO) Take 5,000 Int'l Units by mouth daily.    [provider]  cyclobenzaprine (FLEXERIL) 10 MG tablet Take 1 tablet (10 mg total) by mouth at bedtime. Patient not taking: Reported on 11/05/2022 02/10/22   Melvenia Beam, MD  DULoxetine (CYMBALTA) 30 MG capsule Take 30 mg by mouth daily. Patient not taking: Reported on 11/05/2022 11/26/21   [provider]  fluticasone (FLONASE) 50  MCG/ACT nasal spray Place into both nostrils. 01/08/22   [provider]  losartan (COZAAR) 50 MG tablet Take 50 mg by mouth daily.      [provider]  Omega-3 1000 MG CAPS Take 1 capsule by mouth daily.    [provider]  OVER THE COUNTER MEDICATION Take 540 mg by mouth daily. Black cohash po daily    [provider]  rizatriptan (MAXALT-MLT) 10 MG disintegrating tablet Take 1 tablet (10 mg total) by mouth as needed for migraine. May repeat in 2 hours if needed. (Max 2/24hours) only 2-3 x week. 05/15/22   Melvenia Beam, MD  rizatriptan (MAXALT-MLT) 10 MG disintegrating tablet Take 1 tablet (10 mg total) by mouth as needed for migraine. May repeat in 2 hours if needed 11/05/22   Melvenia Beam, MD  traMADol (ULTRAM) 50 MG tablet Take 50 mg by mouth as needed.    [provider]  venlafaxine XR (EFFEXOR-XR) 75 MG 24 hr capsule Take 75 mg by mouth daily. 10/30/22   [provider]  vitamin E 180 MG (400 UNITS) capsule Take 400 Units by mouth daily.    [provider]      Allergies    Amoxicillin, Bupropion, Duloxetine hcl, Fexofenadine, Loratadine, Naproxen, Pregabalin, Trazodone hcl, Verapamil, and Adhesive [tape]    Review of Systems   Review of Systems  Respiratory:  Positive for shortness of breath.     Physical Exam Updated Vital Signs BP Marland Kitchen)  147/88   Pulse 94   Temp 98.2 F (36.8 C) (Oral)   Resp 17   Wt 79.8 kg   LMP 08/08/2012   SpO2 100%   BMI 30.21 kg/m  Physical Exam Vitals and nursing note reviewed.  Constitutional:      General: She is not in acute distress.    Appearance: She is well-developed.  HENT:     Head: Normocephalic and atraumatic.  Eyes:     Conjunctiva/sclera: Conjunctivae normal.  Cardiovascular:     Rate and Rhythm: Normal rate and regular rhythm.     Heart sounds: No murmur heard. Pulmonary:     Effort: Pulmonary effort is normal. No respiratory distress.     Breath sounds: No  decreased breath sounds, wheezing, rhonchi or rales.  Abdominal:     Palpations: Abdomen is soft.     Tenderness: There is no abdominal tenderness.  Musculoskeletal:        General: No swelling.     Cervical back: Neck supple.  Skin:    General: Skin is warm and dry.     Capillary Refill: Capillary refill takes less than 2 seconds.  Neurological:     Mental Status: She is alert.  Psychiatric:        Mood and Affect: Mood normal.     ED Results / Procedures / Treatments   Labs (all labs ordered are listed, but only abnormal results are displayed) Labs Reviewed  BASIC METABOLIC PANEL - Abnormal; Notable for the following components:      Result Value   Sodium 134 (*)    All other components within normal limits  CBC - Abnormal; Notable for the following components:   Platelets 474 (*)    All other components within normal limits  BRAIN NATRIURETIC PEPTIDE  TROPONIN I (HIGH SENSITIVITY)  TROPONIN I (HIGH SENSITIVITY)    EKG EKG Interpretation  Date/Time:  Sunday January 18 2023 13:41:44 EST Ventricular Rate:  100 PR Interval:  124 QRS Duration: 68 QT Interval:  334 QTC Calculation: 430 R Axis:   51 Text Interpretation: Normal sinus rhythm Low voltage QRS Cannot rule out Anterior infarct , age undetermined  nonspecific ST changes improved when compared to Jan 09 2023 - were likely rate related Confirmed by Sherwood Gambler [phone]) on 01/18/2023 4:10:36 PM  Radiology CT Angio Chest PE W and/or Wo Contrast  Result Date: 01/18/2023 CLINICAL DATA:  Positive for flu over a week ago and feeling worse. Chest pressure and back pain EXAM: CT ANGIOGRAPHY CHEST WITH CONTRAST TECHNIQUE: Multidetector CT imaging of the chest was performed using the standard protocol during bolus administration of intravenous contrast. Multiplanar CT image reconstructions and MIPs were obtained to evaluate the vascular anatomy. RADIATION DOSE REDUCTION: This exam was performed according to the  departmental dose-optimization program which includes automated exposure control, adjustment of the mA and/or kV according to patient size and/or use of iterative reconstruction technique. CONTRAST:  71m OMNIPAQUE IOHEXOL 350 MG/ML SOLN COMPARISON:  Radiographs 01/09/2023 and CT chest 08/09/2007 FINDINGS: Cardiovascular: Satisfactory opacification of the pulmonary arteries to the segmental level. No evidence of pulmonary embolism. Normal heart size. No pericardial effusion. Mediastinum/Nodes: No enlarged mediastinal, hilar, or axillary lymph nodes. Thyroid gland, trachea, and esophagus demonstrate no significant findings. Lungs/Pleura: Lungs are clear. No pleural effusion or pneumothorax. Upper Abdomen: No acute abnormality. Musculoskeletal: No chest wall abnormality. No acute osseous findings. Review of the MIP images confirms the above findings. IMPRESSION: Negative for acute pulmonary embolism. No acute abnormality  in the chest. Electronically Signed   By: Placido Sou M.D.   On: 01/18/2023 19:30    Procedures Procedures    Medications Ordered in ED Medications  promethazine (PHENERGAN) 6.25 mg in sodium chloride 0.9 % 50 mL IVPB (0 mg Intravenous Stopped 01/18/23 2018)  lactated ringers bolus 500 mL (0 mLs Intravenous Stopped 01/18/23 2036)  iohexol (OMNIPAQUE) 350 MG/ML injection 75 mL (75 mLs Intravenous Contrast Given 01/18/23 1914)    ED Course/ Medical Decision Making/ A&P                             Medical Decision Making Loreena Valeri Hilgeman is a 62 year old female past medical history as documented above presenting to the emergency department for persistent cough.  On arrival to the emergency department patient's blood pressure 147/88, temperature 98.2, pulse 94, respirations 17 she satting 100% on room air.  My initial assessment of the patient she has clear lungs bilaterally has a very nonfocal exam.  Findings initial differential is broad given her persistent cough I did a  bedside POCUS ultrasound which showed the patient's ejection fraction is grossly maintained no evidence of large pericardial effusion EKG shows normal sinus rhythm with no ST segment elevation or depression concerning for acute ischemia further, patient has 2 negative troponins BMP and CMP are grossly unremarkable.  I opted to obtain a CTA PE.  I did this because of her persistent cough or tachycardia as well as subjective dyspnea on exertion.  CTA PE shows no evidence of acute pulmonary emboli nor any other lung pathology.  Given this fact I am not concerned for pneumonia pneumothorax or other parenchymal lung disease.  Patient's vitals remained stable in the emergency department she had no acute O2 requirement.  Given this fact I believe her symptoms are most in line with a reactive airway or postviral reactive airway.  She has home Symbicort that she has been taking 2 times daily intermittently I instructed her to begin taking this regularly.  I placed an ambulatory referral to pulmonology for her subacute cough to believe that she may be suffering from exercise-induced asthma versus reactive airway disease in the setting of her current viral illness.  Patient was discharged home to self-care.   Amount and/or Complexity of Data Reviewed Labs: ordered. Radiology: ordered.  Risk Prescription drug management.           Final Clinical Impression(s) / ED Diagnoses Final diagnoses:  Subacute cough    Rx / DC Orders ED Discharge Orders          Ordered    Ambulatory referral to Pulmonology        01/18/23 2033              Donzetta Matters, MD 01/19/23 0136    Sherwood Gambler, MD 01/23/23 [phone]

## 2023-01-20 DIAGNOSIS — H6993 Unspecified Eustachian tube disorder, bilateral: Secondary | ICD-10-CM | POA: Diagnosis not present

## 2023-01-20 DIAGNOSIS — J45909 Unspecified asthma, uncomplicated: Secondary | ICD-10-CM | POA: Diagnosis not present

## 2023-01-20 DIAGNOSIS — G9331 Postviral fatigue syndrome: Secondary | ICD-10-CM | POA: Diagnosis not present

## 2023-01-22 ENCOUNTER — Institutional Professional Consult (permissible substitution): Payer: Medicare Other | Admitting: Pulmonary Disease

## 2023-01-23 DIAGNOSIS — J4540 Moderate persistent asthma, uncomplicated: Secondary | ICD-10-CM | POA: Diagnosis not present

## 2023-01-23 DIAGNOSIS — R002 Palpitations: Secondary | ICD-10-CM | POA: Diagnosis not present

## 2023-01-26 NOTE — Progress Notes (Unsigned)
Office Visit    Patient Name: Kiara Wilson Date of Encounter: 01/27/2023  PCP:  Carol Ada, [facility]  Cardiologist:  Dorris Carnes, MD  Advanced Practice Provider:  No care team member to display Electrophysiologist:  None   HPI    Kiara Wilson is a 62 y.o. female with a past medical history significant for asthma, fibromyalgia, headaches, palpitations, and tachycardia presents today for overdue follow-up appointment.  She was originally seen in 2011 by cardiologist in [place].  BP noted to be labile verapamil been added and Toprol DC'd.  Holter monitor showed frequent PACs.  Verapamil increased 180/day.  Also felt to be having panic spells, Klonopin added.  Cymbalta added for fibromyalgia.  Followed by [facility] physicians and had been seen a month prior to her last appointment (02/2020).  She was on losartan.  Dose was increased to try to help blood pressure control.  She was feeling tired all the time.  Denied chest pain.  Some dizziness but no syncope.  Did not drink any caffeine.  Today, she states that she has been sick since January 21.  She has been to the ER 3 times.  The first time she went to [place] [facility] and it took care of her and she was around 3 hours.  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  She then got worse with coughing and fever.  She went back to the ER and they did another EKG.  She waited 5 hours and then she left AMA.  Results showed a diagnosis of flu B.  She then went to walk-in clinic and got some antibiotics.  She was having shortness of breath then, very tired and anxious.  She then went back to the walk-in clinic and waited in the ER for 9 hours and they took care of her with "first-aid".  They did a CT scan and an ultrasound of her heart which all came back negative (per the patient).  They did recommend that she sees her cardiologist right away for further management.  She endorses  continued palpitations and has a history of PVCs.  She says her heart rates been a regular.  We discussed the monitor as well as some medication titration.  Reports no chest pain, pressure, or tightness. No edema, orthopnea, PND.   Past Medical History    Past Medical History:  Diagnosis Date   Allergy    Anxiety    Arthritis    Breast cancer ([place]) 09/2007   Depression    Depression with anxiety 01/08/2020   Essential hypertension 01/08/2020   Fibromyalgia    GERD (gastroesophageal reflux disease)    Hypertension    Migraine 01/08/2020   Migraines    Overactive bladder    Personal history of radiation therapy    Past Surgical History:  Procedure Laterality Date   BREAST BIOPSY Left    benign   BREAST LUMPECTOMY Right 2008   EYE SURGERY     PELVIC LAPAROSCOPY  1992    Allergies  Allergies  Allergen Reactions   Amoxicillin    Bupropion Other (See Comments)    Rash, itching   Duloxetine Hcl     Other reaction(s): foggy headed on 60 mg   Fexofenadine     Other reaction(s): Unknown   Loratadine     Other reaction(s): Unknown   Naproxen     Other reaction(s): migrane   Pregabalin  Other reaction(s): feet swelling   Trazodone Hcl     Other reaction(s): nightmares   Verapamil     Other reaction(s): constipation   Adhesive [Tape] Rash      EKGs/Labs/Other Studies Reviewed:   The following studies were reviewed today:  CTA of the chest 01/18/23  IMPRESSION: Negative for acute pulmonary embolism. No acute abnormality in the chest   EKG:  EKG is not ordered today.  Reviewed her past two EKGs. Both NSR. Isolated PVC.   Recent Labs: 12/31/2022: ALT 23 01/18/2023: BUN 10; Creatinine, Ser 0.74; Hemoglobin 13.9; Platelets 474; Potassium 4.3; Sodium 134  Recent Lipid Panel No results found for: "CHOL", "TRIG", "HDL", "CHOLHDL", "VLDL", "LDLCALC", "LDLDIRECT"  Home Medications   Current Meds  Medication Sig   acetaminophen (TYLENOL) 500 MG tablet Take 500 mg  by mouth 2 (two) times daily.   albuterol (VENTOLIN HFA) 108 (90 Base) MCG/ACT inhaler Inhale 2 puffs into the lungs every 4 (four) hours as needed.   APPLE CIDER VINEGAR PO Take 2 each by mouth daily.   cetirizine (ZYRTEC) 10 MG tablet Take 10 mg by mouth daily.   Cholecalciferol (VITAMIN D3 PO) Take 5,000 Int'l Units by mouth daily.   clonazePAM (KLONOPIN) 1 MG tablet 1/2-1 TABLET AS NEEDED ORALLY TWICE A DAY 30 DAYS   DULoxetine (CYMBALTA) 30 MG capsule Take 30 mg by mouth daily.   fluticasone (FLONASE) 50 MCG/ACT nasal spray Place into both nostrils.   losartan (COZAAR) 25 MG tablet Take 1 tablet (25 mg total) by mouth daily.   metoprolol succinate (TOPROL-XL) 50 MG 24 hr tablet Take 1 tablet (50 mg total) by mouth daily. Take with or immediately following a meal.   OVER THE COUNTER MEDICATION Take 540 mg by mouth daily. Black cohash po daily   rizatriptan (MAXALT-MLT) 10 MG disintegrating tablet Take 1 tablet (10 mg total) by mouth as needed for migraine. May repeat in 2 hours if needed   traMADol (ULTRAM) 50 MG tablet Take 50 mg by mouth as needed.   venlafaxine XR (EFFEXOR-XR) 75 MG 24 hr capsule Take 75 mg by mouth daily.   [DISCONTINUED] losartan (COZAAR) 50 MG tablet Take 50 mg by mouth daily.     [DISCONTINUED] metoprolol succinate (TOPROL-XL) 25 MG 24 hr tablet Take 25 mg by mouth daily.     Review of Systems      All other systems reviewed and are otherwise negative except as noted above.  Physical Exam    VS:  BP 118/62   Pulse 87   Ht '5\' 4"'$  (1.626 m)   Wt 176 lb (79.8 kg)   LMP 08/08/2012   SpO2 97%   BMI 30.21 kg/m  , BMI Body mass index is 30.21 kg/m.  Wt Readings from Last 3 Encounters:  01/27/23 176 lb (79.8 kg)  01/18/23 176 lb (79.8 kg)  01/09/23 186 lb 1.1 oz (84.4 kg)     GEN: Well nourished, well developed, in no acute distress. HEENT: normal. Neck: Supple, no JVD, carotid bruits, or masses. Cardiac: RRR, no murmurs, rubs, or gallops. No clubbing,  cyanosis, edema.  Radials/PT 2+ and equal bilaterally.  Respiratory:  Respirations regular and unlabored, clear to auscultation bilaterally. GI: Soft, nontender, nondistended. MS: No deformity or atrophy. Skin: Warm and dry, no rash. Neuro:  Strength and sensation are intact. Psych: Normal affect.  Assessment & Plan    Palpitations -Plan for zio monitor which was applied today -labs including CBC, CMP, TSH, and mag -increase metoprolol  succinate to '50mg'$  daily -decrease Cozaar to '25mg'$  daily to make more BP room for BB titration  HTN -well controlled today -med changes above  HLD -LDL 94 -No hx of CAD so < 100 is the goal -Will be due for repeat lipid panel in the fall     Disposition: Follow up 4-6 weeks with Dorris Carnes, MD or APP.  Signed, Elgie Collard, PA-C 01/27/2023, 5:10 PM [facility]

## 2023-01-27 ENCOUNTER — Encounter: Payer: Self-pay | Admitting: Physician Assistant

## 2023-01-27 ENCOUNTER — Ambulatory Visit: Payer: Medicare Other | Attending: Physician Assistant | Admitting: Physician Assistant

## 2023-01-27 ENCOUNTER — Ambulatory Visit (INDEPENDENT_AMBULATORY_CARE_PROVIDER_SITE_OTHER): Payer: Medicare Other

## 2023-01-27 VITALS — BP 118/62 | HR 87 | Ht 64.0 in | Wt 176.0 lb

## 2023-01-27 DIAGNOSIS — R002 Palpitations: Secondary | ICD-10-CM

## 2023-01-27 DIAGNOSIS — E785 Hyperlipidemia, unspecified: Secondary | ICD-10-CM | POA: Diagnosis not present

## 2023-01-27 DIAGNOSIS — I10 Essential (primary) hypertension: Secondary | ICD-10-CM | POA: Diagnosis not present

## 2023-01-27 MED ORDER — LOSARTAN POTASSIUM 25 MG PO TABS: 25.0000 mg | ORAL_TABLET | Freq: Every day | ORAL | 3 refills | Status: DC

## 2023-01-27 MED ORDER — METOPROLOL SUCCINATE ER 50 MG PO TB24: 50.0000 mg | ORAL_TABLET | Freq: Every day | ORAL | 3 refills | Status: DC

## 2023-01-27 NOTE — Progress Notes (Unsigned)
ZIO XT serial [id number] from office inventory applied to patient.  Dr. Harrington Challenger to read.

## 2023-01-27 NOTE — Patient Instructions (Signed)
Medication Instructions:  Your physician has recommended you make the following change in your medication: 1.Increase metoprolol succinate (Toprol-XL) to 50 mg daily 2.Decrease losartan (Cozaar) to 25 mg daily *If you need a refill on your cardiac medications before your next appointment, please call your pharmacy*   Lab Work: CBC, BMP, TSH and a Mag today If you have labs (blood work) drawn today and your tests are completely normal, you will receive your results only by: [facility] (if you have MyChart) OR A paper copy in the mail If you have any lab test that is abnormal or we need to change your treatment, we will call you to review the results.   Testing/Procedures: [place] [place]- Long Term Monitor Instructions  Your physician has requested you wear a ZIO patch monitor for 14 days.  This is a single patch monitor. [employer] supplies one patch monitor per enrollment. Additional stickers are not available. Please do not apply patch if you will be having a Nuclear Stress Test,  Echocardiogram, Cardiac CT, MRI, or Chest Xray during the period you would be wearing the  monitor. The patch cannot be worn during these tests. You cannot remove and re-apply the  ZIO XT patch monitor.  Your ZIO patch monitor will be mailed 3 day USPS to your address on file. It may take 3-5 days  to receive your monitor after you have been enrolled.  Once you have received your monitor, please review the enclosed instructions. Your monitor  has already been registered assigning a specific monitor serial # to you.  Billing and Patient Assistance Program Information  We have supplied [employer] with any of your insurance information on file for billing purposes. [employer] offers a sliding scale Patient Assistance Program for patients that do not have  insurance, or whose insurance does not completely cover the cost of the ZIO monitor.  You must apply for the Patient Assistance Program to qualify for this discounted  rate.  To apply, please call [employer] at [phone], select option 4, select option 2, ask to apply for  Patient Assistance Program. Theodore Demark will ask your household income, and how many people  are in your household. They will quote your out-of-pocket cost based on that information.  [employer] will also be able to set up a 42-month interest-free payment plan if needed.  Applying the monitor   Shave hair from upper left chest.  Hold abrader disc by orange tab. Rub abrader in 40 strokes over the upper left chest as  indicated in your monitor instructions.  Clean area with 4 enclosed alcohol pads. Let dry.  Apply patch as indicated in monitor instructions. Patch will be placed under collarbone on left  side of chest with arrow pointing upward.  Rub patch adhesive wings for 2 minutes. Remove white label marked "1". Remove the white  label marked "2". Rub patch adhesive wings for 2 additional minutes.  While looking in a mirror, press and release button in center of patch. A small green light will  flash 3-4 times. This will be your only indicator that the monitor has been turned on.  Do not shower for the first 24 hours. You may shower after the first 24 hours.  Press the button if you feel a symptom. You will hear a small click. Record Date, Time and  Symptom in the Patient Logbook.  When you are ready to remove the patch, follow instructions on the last 2 pages of Patient  Logbook. Stick patch monitor onto the last  page of Patient Logbook.  Place Patient Logbook in the blue and white box. Use locking tab on box and tape box closed  securely. The blue and white box has prepaid postage on it. Please place it in the mailbox as  soon as possible. Your physician should have your test results approximately 7 days after the  monitor has been mailed back to [facility].  Call [facility] at [phone] if you have questions regarding  your ZIO XT patch monitor. Call them  immediately if you see an orange light blinking on your  monitor.  If your monitor falls off in less than 4 days, contact our Monitor department at [phone].  If your monitor becomes loose or falls off after 4 days call [employer] at [phone] for  suggestions on securing your monitor    Follow-Up: At [facility], you and your health needs are our priority.  As part of our continuing mission to provide you with exceptional heart care, we have created designated Provider Care Teams.  These Care Teams include your primary Cardiologist (physician) and Advanced Practice Providers (APPs -  Physician Assistants and Nurse Practitioners) who all work together to provide you with the care you need, when you need it.   Your next appointment:   1 month(s)  Provider:   Dorris Carnes, MD  or APP  Other Instructions Check your blood pressure and heart rate daily, one hour after taking your morning medications for 2 weeks, keep a log  and send [place] the readings through mychart.

## 2023-01-28 LAB — CBC
Hematocrit: 38.8 % (ref 34.0–46.6)
Hemoglobin: 13 g/dL (ref 11.1–15.9)
MCH: 29.4 pg (ref 26.6–33.0)
MCHC: 33.5 g/dL (ref 31.5–35.7)
MCV: 88 fL (ref 79–97)
Platelets: 424 10*3/uL (ref 150–450)
RBC: 4.42 x10E6/uL (ref 3.77–5.28)
RDW: 13.5 % (ref 11.7–15.4)
WBC: 7 10*3/uL (ref 3.4–10.8)

## 2023-01-28 LAB — BASIC METABOLIC PANEL
BUN/Creatinine Ratio: 17 (ref 12–28)
BUN: 14 mg/dL (ref 8–27)
CO2: 23 mmol/L (ref 20–29)
Calcium: 9.7 mg/dL (ref 8.7–10.3)
Chloride: 103 mmol/L (ref 96–106)
Creatinine, Ser: 0.82 mg/dL (ref 0.57–1.00)
Glucose: 89 mg/dL (ref 70–99)
Potassium: 4.7 mmol/L (ref 3.5–5.2)
Sodium: 140 mmol/L (ref 134–144)
eGFR: 81 mL/min/{1.73_m2} (ref >59)

## 2023-01-28 LAB — MAGNESIUM: Magnesium: 2.1 mg/dL (ref 1.6–2.3)

## 2023-01-28 LAB — TSH: TSH: 1.27 u[IU]/mL (ref 0.450–4.500)

## 2023-01-29 ENCOUNTER — Telehealth: Payer: Medicare Other | Admitting: Family Medicine

## 2023-01-29 ENCOUNTER — Telehealth: Payer: Self-pay | Admitting: Physician Assistant

## 2023-01-29 NOTE — Telephone Encounter (Signed)
I spoke w this patient. She was wanting to talk to [facility].  She is still feeling awful.  Same symptoms as when she was here 2 days ago but all are worse and now she has a stiff neck too.   She says she is disoriented and is afraid to drive anywhere because she forgets where she is going.  She wanted to know what the monitor she is wearing shows.  I adv we will not know what the monitor shows until after it is returned and processed so she asked if she should send it back today because she cannot wait that long to find out anything and she thinks she needs an echocardiogram instead because this isn't helpful.  I asked her multiple times to contact her PCP for her symptoms since she does not wish to return to the ER but she states that they do not help her.  They have prescribed multiple antibiotics that cause her nausea.   She states is drinking adequately.  No vomiting.  BP good 118/78.  She has reviewed all of her recent labs from her visit on 01/27/23.  All were wnl.    I adv I would let Johann Capers know as she requested.

## 2023-01-29 NOTE — Telephone Encounter (Signed)
New message:   Patient says she have been wearing a monitor  for 4 days. She says she feel worse.    Pt c/o Shortness Of Breath: STAT if SOB developed within the last 24 hours or pt is noticeably SOB on the phone  1. Are you currently SOB (can you hear that pt is SOB on the phone)? yes 2. How long have you been experiencing SOB?  Since December, getting worse  3. Are you SOB when sitting or when up moving around? both  4.  Are you currently experiencing any other symptoms? Headaches, confused, disoriented, weak ,anxiety , just do not feeling good, tingling in left  and stiffness in her neck

## 2023-01-29 NOTE — Progress Notes (Signed)
Pt came online to say she had spoken with her physician already and did not need to do the visit. DWB

## 2023-01-30 NOTE — Telephone Encounter (Signed)
January 29, 2023 Elgie Collard, PA-C  to Rodman Key, RN     01/29/23  5:52 PM  The patient told me that she already had an ultrasound of her heart which came back negative.  I was unable to see these records unfortunately.  That is why we did not order an echocardiogram when I saw her.  Her #1 complaint was palpitations and we increased her metoprolol 50 mg daily.  This will take a few days to kick in.  Please tell her to be patient  ZIO monitor will help US obtain the information we need for the next steps in her medical care.  I would encourage her to keep this in place so that we can get good results.  She has been to the ER and urgent care several times and been dissatisfied with the care.  Unfortunately, there is not anything else that we can do until we have completed our workup.  The only other option I can give her is to go to the ER and try to get admitted to get things done more quickly however, I cannot guarantee that they will admit her.  We also ordered some labs when she was here in the office.  As soon as I get those results I will send them over to her.  Thanks Elgie Collard, PA-C     Will forward these recommendations to Tessa's covering CMA for further follow-up with the pt.

## 2023-01-30 NOTE — Telephone Encounter (Signed)
Patient states that she was calling back because she state no one has responded to her message. Please advise

## 2023-01-30 NOTE — Telephone Encounter (Signed)
Spoke with patient and she states that she would like to have an echo done here at our office as what she had was not really an echo. She is complaining of shortness of breath and gave the example of when she talks a lot. She says that she is not going to wear the zio for 14 days, she may wear it for 7 and she is going to take it off and mail it in. She says that she has been living in [place] for 34 yrs and now she gets so disoriented and lost easily at times and she does not even know where she is going. She reports that she did not complain of palpitations at her visit, she complained of PVC's but she says that she does feel better today. She circled back to the zio and states that she will wear it for the 14 days as ordered if that is what Tessa recommends. I was informing her that per Johann Capers, she could report to the ER or urgent care to see if she could get any quicker work-up, but she did not let me finish my sentence, she interrupted me to say that she had a horrible experience at the ER and that she is definitely not going back there but she will go to urgent care if she feels that her symptoms worsen. She is aware that I will route msg to [facility] for advice on ordering echo and be in contact with her next week.

## 2023-02-02 ENCOUNTER — Other Ambulatory Visit: Payer: Self-pay | Admitting: *Deleted

## 2023-02-02 DIAGNOSIS — R002 Palpitations: Secondary | ICD-10-CM

## 2023-02-02 NOTE — Telephone Encounter (Signed)
I have spoke with patient and she will take the zio off after 7 days and mail it back. She is aware that I have placed the order for the echo and she will get a call to schedule it.

## 2023-02-04 ENCOUNTER — Institutional Professional Consult (permissible substitution): Payer: Medicare Other | Admitting: Student

## 2023-02-04 ENCOUNTER — Institutional Professional Consult (permissible substitution): Payer: Medicare Other | Admitting: Pulmonary Disease

## 2023-02-05 ENCOUNTER — Ambulatory Visit (HOSPITAL_COMMUNITY): Payer: Medicare Other | Attending: Cardiology

## 2023-02-05 DIAGNOSIS — I340 Nonrheumatic mitral (valve) insufficiency: Secondary | ICD-10-CM

## 2023-02-05 DIAGNOSIS — I361 Nonrheumatic tricuspid (valve) insufficiency: Secondary | ICD-10-CM | POA: Diagnosis not present

## 2023-02-05 DIAGNOSIS — I10 Essential (primary) hypertension: Secondary | ICD-10-CM | POA: Diagnosis not present

## 2023-02-05 DIAGNOSIS — R002 Palpitations: Secondary | ICD-10-CM | POA: Diagnosis not present

## 2023-02-05 LAB — ECHOCARDIOGRAM COMPLETE
Area-P 1/2: 3.27 cm2
S' Lateral: 2.9 cm

## 2023-02-09 ENCOUNTER — Telehealth: Payer: Self-pay | Admitting: Internal Medicine

## 2023-02-09 NOTE — Telephone Encounter (Signed)
Spoke with patient regarding echo results. She would like to know if we can do anything about the mild leak of her mitral valve and mild leak of her tricuspid valve.

## 2023-02-09 NOTE — Telephone Encounter (Signed)
Patient was calling in bout her results. Please advise

## 2023-02-10 NOTE — Telephone Encounter (Signed)
Patient aware.

## 2023-02-12 DIAGNOSIS — R002 Palpitations: Secondary | ICD-10-CM | POA: Diagnosis not present

## 2023-02-16 ENCOUNTER — Encounter: Payer: Self-pay | Admitting: Internal Medicine

## 2023-02-16 DIAGNOSIS — M47816 Spondylosis without myelopathy or radiculopathy, lumbar region: Secondary | ICD-10-CM | POA: Diagnosis not present

## 2023-02-26 DIAGNOSIS — J45909 Unspecified asthma, uncomplicated: Secondary | ICD-10-CM | POA: Diagnosis not present

## 2023-02-26 DIAGNOSIS — J309 Allergic rhinitis, unspecified: Secondary | ICD-10-CM | POA: Diagnosis not present

## 2023-02-26 DIAGNOSIS — R7303 Prediabetes: Secondary | ICD-10-CM | POA: Diagnosis not present

## 2023-02-26 DIAGNOSIS — K219 Gastro-esophageal reflux disease without esophagitis: Secondary | ICD-10-CM | POA: Diagnosis not present

## 2023-02-26 DIAGNOSIS — I10 Essential (primary) hypertension: Secondary | ICD-10-CM | POA: Diagnosis not present

## 2023-02-26 DIAGNOSIS — M797 Fibromyalgia: Secondary | ICD-10-CM | POA: Diagnosis not present

## 2023-02-26 DIAGNOSIS — R002 Palpitations: Secondary | ICD-10-CM | POA: Diagnosis not present

## 2023-02-26 DIAGNOSIS — Z0000 Encounter for general adult medical examination without abnormal findings: Secondary | ICD-10-CM | POA: Diagnosis not present

## 2023-02-26 DIAGNOSIS — M545 Low back pain, unspecified: Secondary | ICD-10-CM | POA: Diagnosis not present

## 2023-02-26 DIAGNOSIS — E559 Vitamin D deficiency, unspecified: Secondary | ICD-10-CM | POA: Diagnosis not present

## 2023-03-02 NOTE — Progress Notes (Unsigned)
Office Visit    Patient Name: Kiara Wilson Date of Encounter: 03/03/2023  PCP:  Carol Ada, [facility]  Cardiologist:  Dorris Carnes, MD  Advanced Practice Provider:  No care team member to display Electrophysiologist:  None   HPI    Kiara Wilson is a 62 y.o. female with a past medical history significant for asthma, fibromyalgia, headaches, palpitations, and tachycardia presents today for overdue follow-up appointment.  She was originally seen in 2011 by cardiologist in [place].  BP noted to be labile verapamil been added and Toprol DC'd.  Holter monitor showed frequent PACs.  Verapamil increased 180/day.  Also felt to be having panic spells, Klonopin added.  Cymbalta added for fibromyalgia.  Followed by [facility] physicians and had been seen a month prior to her last appointment (02/2020).  She was on losartan.  Dose was increased to try to help blood pressure control.  She was feeling tired all the time.  Denied chest pain.  Some dizziness but no syncope.  Did not drink any caffeine.  Today, she states that she has been sick since January 21.  She has been to the ER 3 times.  The first time she went to [place] long and it took care of her and she was around 3 hours.  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  She then got worse with coughing and fever.  She went back to the ER and they did another EKG.  She waited 5 hours and then she left AMA.  Results showed a diagnosis of flu B.  She then went to walk-in clinic and got some antibiotics.  She was having shortness of breath then, very tired and anxious.  She then went back to the walk-in clinic and waited in the ER for 9 hours and they took care of her with "first-aid".  They did a CT scan and an ultrasound of her heart which all came back negative (per the patient).  They did recommend that she sees her cardiologist right away for further management.  She endorses  continued palpitations and has a history of PVCs.  She says her heart rates been a regular.  We discussed the monitor as well as some medication titration.  Today, she tells me that she was feeling lethargic, sleepy, and nausea on metoprolol '50mg'$  daily and went back down to '25mg'$  daily. We discussed several options including BID dosing of metoprolol, or changing her BB to labetalol or atenolol. She is still having fast heart beats and her pulse is 95 today. She feels likes she need more support and suppression of those extra beats.   Reports no shortness of breath nor dyspnea on exertion. Reports no chest pain, pressure, or tightness. No edema, orthopnea, PND.    Past Medical History    Past Medical History:  Diagnosis Date   Allergy    Anxiety    Arthritis    Breast cancer ([place]) 09/2007   Depression    Depression with anxiety 01/08/2020   Essential hypertension 01/08/2020   Fibromyalgia    GERD (gastroesophageal reflux disease)    Hypertension    Migraine 01/08/2020   Migraines    Overactive bladder    Personal history of radiation therapy    Past Surgical History:  Procedure Laterality Date   BREAST BIOPSY Left    benign   BREAST LUMPECTOMY Right 2008   EYE SURGERY     PELVIC LAPAROSCOPY  1992    Allergies  Allergies  Allergen Reactions   Amoxicillin    Bupropion Other (See Comments)    Rash, itching   Duloxetine Hcl     Other reaction(s): foggy headed on 60 mg   Fexofenadine     Other reaction(s): Unknown   Loratadine     Other reaction(s): Unknown   Naproxen     Other reaction(s): migrane   Pregabalin     Other reaction(s): feet swelling   Trazodone Hcl     Other reaction(s): nightmares   Verapamil     Other reaction(s): constipation   Adhesive [Tape] Rash      EKGs/Labs/Other Studies Reviewed:   The following studies were reviewed today:  CTA of the chest 01/18/23  IMPRESSION: Negative for acute pulmonary embolism. No acute abnormality in  the chest   EKG:  EKG is not ordered today.  Reviewed her past two EKGs. Both NSR. Isolated PVC.   Recent Labs: 12/31/2022: ALT 23 01/27/2023: BUN 14; Creatinine, Ser 0.82; Hemoglobin 13.0; Magnesium 2.1; Platelets 424; Potassium 4.7; Sodium 140; TSH 1.270  Recent Lipid Panel No results found for: "CHOL", "TRIG", "HDL", "CHOLHDL", "VLDL", "LDLCALC", "LDLDIRECT"  Home Medications   Current Meds  Medication Sig   acetaminophen (TYLENOL) 500 MG tablet Take 500 mg by mouth 2 (two) times daily.   albuterol (VENTOLIN HFA) 108 (90 Base) MCG/ACT inhaler Inhale 2 puffs into the lungs every 4 (four) hours as needed.   APPLE CIDER VINEGAR PO Take 2 each by mouth daily.   atenolol (TENORMIN) 25 MG tablet Take 1 tablet (25 mg total) by mouth 2 (two) times daily.   cetirizine (ZYRTEC) 10 MG tablet Take 10 mg by mouth daily.   Cholecalciferol (VITAMIN D3 PO) Take 5,000 Int'l Units by mouth daily.   clonazePAM (KLONOPIN) 1 MG tablet 1/2-1 TABLET AS NEEDED ORALLY TWICE A DAY 30 DAYS   cyclobenzaprine (FLEXERIL) 10 MG tablet Take 1 tablet (10 mg total) by mouth at bedtime.   fluticasone (FLONASE) 50 MCG/ACT nasal spray Place into both nostrils.   losartan (COZAAR) 25 MG tablet Take 1 tablet (25 mg total) by mouth daily.   OVER THE COUNTER MEDICATION Take 540 mg by mouth daily. Black cohash po daily   rizatriptan (MAXALT-MLT) 10 MG disintegrating tablet Take 1 tablet (10 mg total) by mouth as needed for migraine. May repeat in 2 hours if needed   traMADol (ULTRAM) 50 MG tablet Take 50 mg by mouth as needed.   venlafaxine XR (EFFEXOR-XR) 75 MG 24 hr capsule Take 75 mg by mouth daily.   [DISCONTINUED] metoprolol succinate (TOPROL-XL) 25 MG 24 hr tablet Take 25 mg by mouth daily.     Review of Systems      All other systems reviewed and are otherwise negative except as noted above.  Physical Exam    VS:  BP 122/88   Pulse 95   Ht '5\' 4"'$  (1.626 m)   Wt 184 lb 12.8 oz (83.8 kg)   LMP 08/08/2012    SpO2 95%   BMI 31.72 kg/m  , BMI Body mass index is 31.72 kg/m.  Wt Readings from Last 3 Encounters:  03/03/23 184 lb 12.8 oz (83.8 kg)  01/27/23 176 lb (79.8 kg)  01/18/23 176 lb (79.8 kg)     GEN: Well nourished, well developed, in no acute distress. HEENT: normal. Neck: Supple, no JVD, carotid bruits, or masses. Cardiac: RRR, no murmurs, rubs, or gallops. No clubbing, cyanosis, edema.  Radials/PT 2+  and equal bilaterally.  Respiratory:  Respirations regular and unlabored, clear to auscultation bilaterally. GI: Soft, nontender, nondistended. MS: No deformity or atrophy. Skin: Warm and dry, no rash. Neuro:  Strength and sensation are intact. Psych: Normal affect.  Assessment & Plan    Palpitations  -zio monitor okay, no dangerous arrhythmias  -labs including CBC, CMP, TSH, and mag -change metoprolol to atenolol -continue Cozaar to '25mg'$  daily to make more BP room for BB titration  HTN -well controlled today -med changes above  HLD -LDL 94 -No hx of CAD so < 100 is the goal -Will be due for repeat lipid panel in the fall      Disposition: Follow up  with Dorris Carnes, MD or APP.  Signed, Elgie Collard, PA-C 03/03/2023, 8:42 AM [place]

## 2023-03-03 ENCOUNTER — Ambulatory Visit: Payer: Medicare Other | Attending: Physician Assistant | Admitting: Physician Assistant

## 2023-03-03 ENCOUNTER — Encounter: Payer: Self-pay | Admitting: Physician Assistant

## 2023-03-03 VITALS — BP 122/88 | HR 95 | Ht 64.0 in | Wt 184.8 lb

## 2023-03-03 DIAGNOSIS — R002 Palpitations: Secondary | ICD-10-CM | POA: Diagnosis not present

## 2023-03-03 DIAGNOSIS — I10 Essential (primary) hypertension: Secondary | ICD-10-CM | POA: Diagnosis not present

## 2023-03-03 DIAGNOSIS — E785 Hyperlipidemia, unspecified: Secondary | ICD-10-CM

## 2023-03-03 MED ORDER — ATENOLOL 25 MG PO TABS: 25.0000 mg | ORAL_TABLET | Freq: Two times a day (BID) | ORAL | 3 refills | Status: DC

## 2023-03-03 NOTE — Patient Instructions (Signed)
Medication Instructions:  1.Stop metoprolol 2.Start atenolol 25 mg twice a day *If you need a refill on your cardiac medications before your next appointment, please call your pharmacy*  Lab Work: None If you have labs (blood work) drawn today and your tests are completely normal, you will receive your results only by: [place] (if you have MyChart) OR A paper copy in the mail If you have any lab test that is abnormal or we need to change your treatment, we will call you to review the results.  Follow-Up: At [facility], you and your health needs are our priority.  As part of our continuing mission to provide you with exceptional heart care, we have created designated Provider Care Teams.  These Care Teams include your primary Cardiologist (physician) and Advanced Practice Providers (APPs -  Physician Assistants and Nurse Practitioners) who all work together to provide you with the care you need, when you need it.  Your next appointment:   3 month(s)  Provider:   Dorris Carnes, MD  or Nicholes Rough, PA-C        Heart-Healthy Eating Plan Many factors influence your heart health, including eating and exercise habits. Heart health is also called coronary health. Coronary risk increases with abnormal blood fat (lipid) levels. A heart-healthy eating plan includes limiting unhealthy fats, increasing healthy fats, limiting salt (sodium) intake, and making other diet and lifestyle changes. What is my plan? Your health care provider may recommend that: You limit your fat intake to _________% or less of your total calories each day. You limit your saturated fat intake to _________% or less of your total calories each day. You limit the amount of cholesterol in your diet to less than _________ mg per day. You limit the amount of sodium in your diet to less than _________ mg per day. What are tips for following this plan? Cooking Cook foods using methods other than frying. Baking,  boiling, grilling, and broiling are all good options. Other ways to reduce fat include: Removing the skin from poultry. Removing all visible fats from meats. Steaming vegetables in water or broth. Meal planning  At meals, imagine dividing your plate into fourths: Fill one-half of your plate with vegetables and green salads. Fill one-fourth of your plate with whole grains. Fill one-fourth of your plate with lean protein foods. Eat 2-4 cups of vegetables per day. One cup of vegetables equals 1 cup (91 g) broccoli or cauliflower florets, 2 medium carrots, 1 large bell pepper, 1 large sweet potato, 1 large tomato, 1 medium white potato, 2 cups (150 g) raw leafy greens. Eat 1-2 cups of fruit per day. One cup of fruit equals 1 small apple, 1 large banana, 1 cup (237 g) mixed fruit, 1 large orange,  cup (82 g) dried fruit, 1 cup (240 mL) 100% fruit juice. Eat more foods that contain soluble fiber. Examples include apples, broccoli, carrots, beans, peas, and barley. Aim to get 25-30 g of fiber per day. Increase your consumption of legumes, nuts, and seeds to 4-5 servings per week. One serving of dried beans or legumes equals  cup (90 g) cooked, 1 serving of nuts is  oz (12 almonds, 24 pistachios, or 7 walnut halves), and 1 serving of seeds equals  oz (8 g). Fats Choose healthy fats more often. Choose monounsaturated and polyunsaturated fats, such as olive and canola oils, avocado oil, flaxseeds, walnuts, almonds, and seeds. Eat more omega-3 fats. Choose salmon, mackerel, sardines, tuna, flaxseed oil, and ground  flaxseeds. Aim to eat fish at least 2 times each week. Check food labels carefully to identify foods with trans fats or high amounts of saturated fat. Limit saturated fats. These are found in animal products, such as meats, butter, and cream. Plant sources of saturated fats include palm oil, palm kernel oil, and coconut oil. Avoid foods with partially hydrogenated oils in them. These contain  trans fats. Examples are stick margarine, some tub margarines, cookies, crackers, and other baked goods. Avoid fried foods. General information Eat more home-cooked food and less restaurant, buffet, and fast food. Limit or avoid alcohol. Limit foods that are high in added sugar and simple starches such as foods made using white refined flour (white breads, pastries, sweets). Lose weight if you are overweight. Losing just 5-10% of your body weight can help your overall health and prevent diseases such as diabetes and heart disease. Monitor your sodium intake, especially if you have high blood pressure. Talk with your health care provider about your sodium intake. Try to incorporate more vegetarian meals weekly. What foods should I eat? Fruits All fresh, canned (in natural juice), or frozen fruits. Vegetables Fresh or frozen vegetables (raw, steamed, roasted, or grilled). Green salads. Grains Most grains. Choose whole wheat and whole grains most of the time. Rice and pasta, including brown rice and pastas made with whole wheat. Meats and other proteins Lean, well-trimmed beef, veal, pork, and lamb. Chicken and [place] without skin. All fish and shellfish. Wild duck, rabbit, pheasant, and venison. Egg whites or low-cholesterol egg substitutes. Dried beans, peas, lentils, and tofu. Seeds and most nuts. Dairy Low-fat or nonfat cheeses, including ricotta and mozzarella. Skim or 1% milk (liquid, powdered, or evaporated). Buttermilk made with low-fat milk. Nonfat or low-fat yogurt. Fats and oils Non-hydrogenated (trans-free) margarines. Vegetable oils, including soybean, sesame, sunflower, olive, avocado, peanut, safflower, corn, canola, and cottonseed. Salad dressings or mayonnaise made with a vegetable oil. Beverages Water (mineral or sparkling). Coffee and tea. Unsweetened ice tea. Diet beverages. Sweets and desserts Sherbet, gelatin, and fruit ice. Small amounts of dark chocolate. Limit all  sweets and desserts. Seasonings and condiments All seasonings and condiments. The items listed above may not be a complete list of foods and beverages you can eat. Contact a dietitian for more options. What foods should I avoid? Fruits Canned fruit in heavy syrup. Fruit in cream or butter sauce. Fried fruit. Limit coconut. Vegetables Vegetables cooked in cheese, cream, or butter sauce. Fried vegetables. Grains Breads made with saturated or trans fats, oils, or whole milk. Croissants. Sweet rolls. Donuts. High-fat crackers, such as cheese crackers and chips. Meats and other proteins Fatty meats, such as hot dogs, ribs, sausage, bacon, rib-eye roast or steak. High-fat deli meats, such as salami and bologna. Caviar. Domestic duck and goose. Organ meats, such as liver. Dairy Cream, sour cream, cream cheese, and creamed cottage cheese. Whole-milk cheeses. Whole or 2% milk (liquid, evaporated, or condensed). Whole buttermilk. Cream sauce or high-fat cheese sauce. Whole-milk yogurt. Fats and oils Meat fat, or shortening. Cocoa butter, hydrogenated oils, palm oil, coconut oil, palm kernel oil. Solid fats and shortenings, including bacon fat, salt pork, lard, and butter. Nondairy cream substitutes. Salad dressings with cheese or sour cream. Beverages Regular sodas and any drinks with added sugar. Sweets and desserts Frosting. Pudding. Cookies. Cakes. Pies. Milk chocolate or white chocolate. Buttered syrups. Full-fat ice cream or ice cream drinks. The items listed above may not be a complete list of foods and beverages to avoid. Contact  a dietitian for more information. Summary Heart-healthy meal planning includes limiting unhealthy fats, increasing healthy fats, limiting salt (sodium) intake and making other diet and lifestyle changes. Lose weight if you are overweight. Losing just 5-10% of your body weight can help your overall health and prevent diseases such as diabetes and heart disease. Focus on  eating a balance of foods, including fruits and vegetables, low-fat or nonfat dairy, lean protein, nuts and legumes, whole grains, and heart-healthy oils and fats. This information is not intended to replace advice given to you by your health care provider. Make sure you discuss any questions you have with your health care provider. Document Revised: 01/13/2022 Document Reviewed: 01/13/2022 Elsevier Patient Education  [place].

## 2023-03-17 DIAGNOSIS — M5416 Radiculopathy, lumbar region: Secondary | ICD-10-CM | POA: Diagnosis not present

## 2023-04-07 DIAGNOSIS — M47816 Spondylosis without myelopathy or radiculopathy, lumbar region: Secondary | ICD-10-CM | POA: Diagnosis not present

## 2023-04-07 DIAGNOSIS — M7918 Myalgia, other site: Secondary | ICD-10-CM | POA: Diagnosis not present

## 2023-04-07 DIAGNOSIS — M7061 Trochanteric bursitis, right hip: Secondary | ICD-10-CM | POA: Diagnosis not present

## 2023-04-07 DIAGNOSIS — M5416 Radiculopathy, lumbar region: Secondary | ICD-10-CM | POA: Diagnosis not present

## 2023-04-07 DIAGNOSIS — M7062 Trochanteric bursitis, left hip: Secondary | ICD-10-CM | POA: Diagnosis not present

## 2023-05-04 DIAGNOSIS — H2513 Age-related nuclear cataract, bilateral: Secondary | ICD-10-CM | POA: Diagnosis not present

## 2023-05-04 DIAGNOSIS — H43812 Vitreous degeneration, left eye: Secondary | ICD-10-CM | POA: Diagnosis not present

## 2023-05-17 DIAGNOSIS — N390 Urinary tract infection, site not specified: Secondary | ICD-10-CM | POA: Diagnosis not present

## 2023-05-17 DIAGNOSIS — R142 Eructation: Secondary | ICD-10-CM | POA: Diagnosis not present

## 2023-05-27 DIAGNOSIS — R140 Abdominal distension (gaseous): Secondary | ICD-10-CM | POA: Diagnosis not present

## 2023-05-27 DIAGNOSIS — N390 Urinary tract infection, site not specified: Secondary | ICD-10-CM | POA: Diagnosis not present

## 2023-06-05 ENCOUNTER — Ambulatory Visit: Payer: Medicare Other | Admitting: Physician Assistant

## 2023-07-06 DIAGNOSIS — M5416 Radiculopathy, lumbar region: Secondary | ICD-10-CM | POA: Diagnosis not present

## 2023-07-06 DIAGNOSIS — M47816 Spondylosis without myelopathy or radiculopathy, lumbar region: Secondary | ICD-10-CM | POA: Diagnosis not present

## 2023-07-21 ENCOUNTER — Other Ambulatory Visit: Payer: Self-pay | Admitting: Family Medicine

## 2023-07-21 DIAGNOSIS — Z1231 Encounter for screening mammogram for malignant neoplasm of breast: Secondary | ICD-10-CM

## 2023-08-03 DIAGNOSIS — M5416 Radiculopathy, lumbar region: Secondary | ICD-10-CM | POA: Diagnosis not present

## 2023-08-18 ENCOUNTER — Other Ambulatory Visit (HOSPITAL_COMMUNITY): Payer: Self-pay | Admitting: Gastroenterology

## 2023-08-18 ENCOUNTER — Ambulatory Visit
Admission: RE | Admit: 2023-08-18 | Discharge: 2023-08-18 | Disposition: A | Discharge status: Home / Self Care | Payer: Medicare Other | Source: Ambulatory Visit | Attending: Family Medicine | Admitting: Family Medicine

## 2023-08-18 DIAGNOSIS — K5730 Diverticulosis of large intestine without perforation or abscess without bleeding: Secondary | ICD-10-CM | POA: Diagnosis not present

## 2023-08-18 DIAGNOSIS — R141 Gas pain: Secondary | ICD-10-CM | POA: Diagnosis not present

## 2023-08-18 DIAGNOSIS — K219 Gastro-esophageal reflux disease without esophagitis: Secondary | ICD-10-CM | POA: Diagnosis not present

## 2023-08-18 DIAGNOSIS — Z1231 Encounter for screening mammogram for malignant neoplasm of breast: Secondary | ICD-10-CM | POA: Diagnosis not present

## 2023-08-18 DIAGNOSIS — R1011 Right upper quadrant pain: Secondary | ICD-10-CM

## 2023-08-18 DIAGNOSIS — K5904 Chronic idiopathic constipation: Secondary | ICD-10-CM | POA: Diagnosis not present

## 2023-08-26 ENCOUNTER — Ambulatory Visit (HOSPITAL_COMMUNITY): Payer: Medicare Other

## 2023-08-26 ENCOUNTER — Encounter (HOSPITAL_COMMUNITY): Payer: Self-pay

## 2023-09-03 DIAGNOSIS — Z23 Encounter for immunization: Secondary | ICD-10-CM | POA: Diagnosis not present

## 2023-09-03 DIAGNOSIS — M47816 Spondylosis without myelopathy or radiculopathy, lumbar region: Secondary | ICD-10-CM | POA: Diagnosis not present

## 2023-09-03 DIAGNOSIS — K219 Gastro-esophageal reflux disease without esophagitis: Secondary | ICD-10-CM | POA: Diagnosis not present

## 2023-09-03 DIAGNOSIS — M797 Fibromyalgia: Secondary | ICD-10-CM | POA: Diagnosis not present

## 2023-09-03 DIAGNOSIS — R7303 Prediabetes: Secondary | ICD-10-CM | POA: Diagnosis not present

## 2023-09-03 DIAGNOSIS — J309 Allergic rhinitis, unspecified: Secondary | ICD-10-CM | POA: Diagnosis not present

## 2023-09-03 DIAGNOSIS — R1013 Epigastric pain: Secondary | ICD-10-CM | POA: Diagnosis not present

## 2023-09-03 DIAGNOSIS — R002 Palpitations: Secondary | ICD-10-CM | POA: Diagnosis not present

## 2023-09-03 DIAGNOSIS — J45909 Unspecified asthma, uncomplicated: Secondary | ICD-10-CM | POA: Diagnosis not present

## 2023-09-03 DIAGNOSIS — I10 Essential (primary) hypertension: Secondary | ICD-10-CM | POA: Diagnosis not present

## 2023-09-03 DIAGNOSIS — M7918 Myalgia, other site: Secondary | ICD-10-CM | POA: Diagnosis not present

## 2023-09-19 ENCOUNTER — Ambulatory Visit (INDEPENDENT_AMBULATORY_CARE_PROVIDER_SITE_OTHER): Payer: Medicare Other

## 2023-09-19 ENCOUNTER — Encounter (HOSPITAL_COMMUNITY): Payer: Self-pay | Admitting: Emergency Medicine

## 2023-09-19 ENCOUNTER — Ambulatory Visit (HOSPITAL_COMMUNITY)
Admission: EM | Admit: 2023-09-19 | Discharge: 2023-09-19 | Disposition: A | Discharge status: Home / Self Care | Payer: Medicare Other | Attending: Internal Medicine | Admitting: Internal Medicine

## 2023-09-19 DIAGNOSIS — R55 Syncope and collapse: Secondary | ICD-10-CM

## 2023-09-19 DIAGNOSIS — S6991XA Unspecified injury of right wrist, hand and finger(s), initial encounter: Secondary | ICD-10-CM | POA: Diagnosis not present

## 2023-09-19 DIAGNOSIS — S63621A Sprain of interphalangeal joint of right thumb, initial encounter: Secondary | ICD-10-CM | POA: Diagnosis not present

## 2023-09-19 MED ORDER — DICLOFENAC SODIUM 1 % EX GEL: 2.0000 g | Freq: Four times a day (QID) | CUTANEOUS | Status: DC

## 2023-09-19 MED ORDER — IBUPROFEN 600 MG PO TABS: 600.0000 mg | ORAL_TABLET | Freq: Four times a day (QID) | ORAL | 0 refills | Status: AC | PRN

## 2023-09-19 MED ORDER — DICLOFENAC SODIUM 1 % EX GEL: 2.0000 g | Freq: Four times a day (QID) | CUTANEOUS | 0 refills | Status: AC

## 2023-09-19 NOTE — Discharge Instructions (Addendum)
No fracture noted on initial review of xray Topical ointment diclofenac gel 4 times daily Motrin 600mg  or Naproxen 220 mg for pain Follow up with primary doctor for syncope

## 2023-09-19 NOTE — ED Provider Notes (Addendum)
[facility]    CSN: [id number] Arrival date & time: 09/19/23  1446      History   Chief Complaint Chief Complaint  Patient presents with   Finger Injury    HPI Kiara Wilson is a 62 y.o. female.   Syncopal episode 4 days ago with fall in kitchen.  Patient has not seen PCP since incident.  On syncopal episode patient has injury to the thumb and first finger of the right hand.  She is concerned for possible fractures     Past Medical History:  Diagnosis Date   Allergy    Anxiety    Arthritis    Breast cancer (HCC) 09/2007   Depression    Depression with anxiety 01/08/2020   Essential hypertension 01/08/2020   Fibromyalgia    GERD (gastroesophageal reflux disease)    Hypertension    Migraine 01/08/2020   Migraines    Overactive bladder    Personal history of radiation therapy     Patient Active Problem List   Diagnosis Date Noted   Cervicalgia 11/05/2022   Cervical myofascial pain syndrome 11/05/2022   Post concussion syndrome 02/10/2022   Allergic rhinitis 02/06/2022   Amnesia 02/06/2022   Anxiety 02/06/2022   Asthma 02/06/2022   Constipation 02/06/2022   Decreased estrogen level 02/06/2022   Dysphagia 02/06/2022   Gastroesophageal reflux disease 02/06/2022   Hiatal hernia 02/06/2022   Insomnia 02/06/2022   Joint pain 02/06/2022   Major depression, single episode 02/06/2022   Personal history of malignant neoplasm of breast 02/06/2022   Recurrent major depression in full remission (HCC) 02/06/2022   Migraine without aura, not refractory 02/06/2022   Vitamin D deficiency 02/06/2022   Closed fracture of distal end of left radius 10/25/2020   Closed fracture of proximal end of ulna 10/25/2020   Fibromyalgia 01/08/2020   Migraine 01/08/2020   Depression with anxiety 01/08/2020   Essential hypertension 01/08/2020   History of osteopenia 01/20/2018   Pain in right knee 01/20/2018   Breast cancer (HCC) 12/25/2011    Past Surgical History:   Procedure Laterality Date   BREAST BIOPSY Left    benign   BREAST LUMPECTOMY Right 2008   EYE SURGERY     PELVIC LAPAROSCOPY  1992    OB History     Gravida  0   Para      Term      Preterm      AB      Living         SAB      IAB      Ectopic      Multiple      Live Births               Home Medications    Prior to Admission medications   Medication Sig Start Date End Date Taking? Authorizing Provider  ibuprofen (ADVIL) 600 MG tablet Take 1 tablet (600 mg total) by mouth every 6 (six) hours as needed. Take with food 09/19/23  Yes Deion Swift, Linde Gillis, NP  acetaminophen (TYLENOL) 500 MG tablet Take 500 mg by mouth 2 (two) times daily.    [provider]  albuterol (VENTOLIN HFA) 108 (90 Base) MCG/ACT inhaler Inhale 2 puffs into the lungs every 4 (four) hours as needed.    [provider]  APPLE CIDER VINEGAR PO Take 2 each by mouth daily.    [provider]  atenolol (TENORMIN) 25 MG tablet Take 1 tablet (25 mg  total) by mouth 2 (two) times daily. 03/03/23   Sharlene Dory, PA-C  cetirizine (ZYRTEC) 10 MG tablet Take 10 mg by mouth daily.    [provider]  Cholecalciferol (VITAMIN D3 PO) Take 5,000 Int'l Units by mouth daily.    [provider]  clonazePAM (KLONOPIN) 1 MG tablet 1/2-1 TABLET AS NEEDED ORALLY TWICE A DAY 30 DAYS    [provider]  cyclobenzaprine (FLEXERIL) 10 MG tablet Take 1 tablet (10 mg total) by mouth at bedtime. 02/10/22   Anson Fret, MD  diclofenac Sodium (VOLTAREN) 1 % GEL Apply 2 g topically 4 (four) times daily. Apply to thumb and pinkie 09/19/23   Mcgregor Tinnon, Linde Gillis, NP  DULoxetine (CYMBALTA) 30 MG capsule Take 30 mg by mouth daily. 11/26/21   [provider]  fluticasone (FLONASE) 50 MCG/ACT nasal spray Place into both nostrils. 01/08/22   [provider]  losartan (COZAAR) 25 MG tablet Take 1 tablet (25 mg total) by mouth daily. 01/27/23   Sharlene Dory, PA-C   OVER THE COUNTER MEDICATION Take 540 mg by mouth daily. Black cohash po daily    [provider]  rizatriptan (MAXALT-MLT) 10 MG disintegrating tablet Take 1 tablet (10 mg total) by mouth as needed for migraine. May repeat in 2 hours if needed 11/05/22   Anson Fret, MD  traMADol (ULTRAM) 50 MG tablet Take 50 mg by mouth as needed.    [provider]  venlafaxine XR (EFFEXOR-XR) 75 MG 24 hr capsule Take 75 mg by mouth daily. 10/30/22   [provider]    Family History Family History  Problem Relation Age of Onset   Migraines Mother    Depression Mother    Fibromyalgia Mother    High blood pressure Father    Diabetes Maternal Grandmother    Diabetes Paternal Grandmother     Social History Social History   Tobacco Use   Smoking status: Never   Smokeless tobacco: Never  Vaping Use   Vaping status: Never Used  Substance Use Topics   Alcohol use: No    Comment: rare   Drug use: No     Allergies   Amoxicillin, Bupropion, Duloxetine hcl, Fexofenadine, Loratadine, Naproxen, Pregabalin, Trazodone hcl, Verapamil, and Adhesive [tape]   Review of Systems Review of Systems  Musculoskeletal:  Positive for joint swelling.       Right hand pinky and thumb swollen, bruising     Physical Exam Triage Vital Signs ED Triage Vitals  Encounter Vitals Group     BP 09/19/23 1512 132/83     Systolic BP Percentile --      Diastolic BP Percentile --      Pulse Rate 09/19/23 1512 66     Resp 09/19/23 1512 16     Temp 09/19/23 1512 98.3 F (36.8 C)     Temp Source 09/19/23 1512 Oral     SpO2 09/19/23 1512 96 %     Weight --      Height --      Head Circumference --      Peak Flow --      Pain Score 09/19/23 1510 7     Pain Loc --      Pain Education --      Exclude from Growth Chart --    No data found.  Updated Vital Signs BP 132/83 (BP Location: Left Arm)   Pulse 66   Temp 98.3 F (36.8 C) (Oral)  Resp 16   LMP 08/08/2012   SpO2 96%    Visual Acuity Right Eye Distance:   Left Eye Distance:   Bilateral Distance:    Right Eye Near:   Left Eye Near:    Bilateral Near:     Physical Exam Vitals and nursing note reviewed.  Constitutional:      Appearance: Normal appearance.  HENT:     Head: Normocephalic and atraumatic.  Eyes:     Pupils: Pupils are equal, round, and reactive to light.  Cardiovascular:     Rate and Rhythm: Normal rate and regular rhythm.     Pulses: Normal pulses.     Heart sounds: Normal heart sounds.  Pulmonary:     Effort: Pulmonary effort is normal.     Breath sounds: Normal breath sounds.  Musculoskeletal:     Right hand: Swelling and tenderness present.     Left hand: Normal.     Comments: Swelling and tenderness of the thumb and pinky of the right hand.  She does have range of motion of both digits with increased pain.  Capillary refill is normal.  Bruising noted at the distal joints of both thumb and pinky.  Normal sensation  Neurological:     Mental Status: She is alert.      UC Treatments / Results  Labs (all labs ordered are listed, but only abnormal results are displayed) Labs Reviewed - No data to display  EKG   Radiology DG Hand Complete Right  Result Date: 09/19/2023 CLINICAL DATA:  Fall and trauma to the right hand. EXAM: RIGHT HAND - COMPLETE 3+ VIEW COMPARISON:  Radiograph dated 08/19/2012. FINDINGS: There is no evidence of fracture or dislocation. There is no evidence of arthropathy or other focal bone abnormality. Soft tissues are unremarkable. IMPRESSION: Negative. Electronically Signed   By: Elgie Collard M.D.   On: 09/19/2023 15:56    Procedures Procedures (including critical care time)  Medications Ordered in UC Medications - No data to display  Initial Impression / Assessment and Plan / UC Course  I have reviewed the triage vital signs and the nursing notes.  Pertinent labs & imaging results that were available during my care of the patient were  reviewed by me and considered in my medical decision making (see chart for details).  Patient is reporting injury to right thumb and right pinky.  Patient reports syncopal episode 4 days ago.  She takes atenolol and amitriptyline at home.  On exam it is noted that her heart rate is only 66 at this time. On exam of the right hand thumb and pinky are both swollen with bruising noted at the distal joint.  She believes that she was holding onto the refrigerator when she passed out. We will x-ray right hand for possible fracture.  Xray was completed and were negative.  We will treat her for strain of the thumb and first finger.  Finger sprain NSAID of choice and topical diclofenac gel for pain.    Patient does not want further workup for syncopal episodes at this time.  She is advised to monitor her heart rate as she may have had a hypotensive or bradycardic response which led to her syncope.  She is advised not to take her atenolol if heart rate is lower than 65.   she is encouraged to follow-up with PCP for syncopal episodes which may be related to her atenolol.  Final Clinical Impressions(s) / UC Diagnoses   Final diagnoses:  Sprain of  interphalangeal joint of right thumb, initial encounter  Syncope and collapse     Discharge Instructions      No fracture noted on initial review of xray Topical ointment diclofenac gel 4 times daily Motrin 600mg  or Naproxen 220 mg for pain Follow up with primary doctor for syncope      ED Prescriptions     Medication Sig Dispense Auth. Provider   ibuprofen (ADVIL) 600 MG tablet Take 1 tablet (600 mg total) by mouth every 6 (six) hours as needed. Take with food 30 tablet Michelangelo Rindfleisch, Linde Gillis, NP   diclofenac Sodium (VOLTAREN) 1 % GEL  (Status: Discontinued) Apply 2 g topically 4 (four) times daily. Apply to thumb and pinkie 100 g Devell Parkerson M, NP   diclofenac Sodium (VOLTAREN) 1 % GEL  (Status: Discontinued) Apply 2 g topically 4 (four) times daily.  Apply to thumb and pinkie 100 g Raphaella Larkin M, NP   diclofenac Sodium (VOLTAREN) 1 % GEL Apply 2 g topically 4 (four) times daily. Apply to thumb and pinkie 100 g Taylr Meuth, Linde Gillis, NP      PDMP not reviewed this encounter.   Nelda Marseille, NP 09/19/23 1926    Nelda Marseille, NP 09/19/23 [phone]

## 2023-09-19 NOTE — ED Triage Notes (Signed)
Pt fell in kitchen 4 days ago and hurt thumb and pinky on right hand. She also complains of pain in right thigh

## 2023-09-25 NOTE — Telephone Encounter (Signed)
Called pt in regards to my chart message.  Reports for the last 2-3 weeks has been feeling very tired, weak, and light headed.  Last night felt really bad nauseous and light headed.  BP 90/57 Reports fainted previously did not go to the hospital waited 3-4 days and went to urgent care.  Had sprain on 2 fingers to right hand.  Reports decreased appetite but drinks at least 8 glasses of water daily.  Reports still has fast heart beats at times.    Advised pt to stop taking losartan due to low BP until Provider responds.  Advised if BP goes high to restart losartan.  Advised to increase fluid intake and eat a salty snack if BP is low and feels bad.  Advised to report to ED if symptoms worsen.  Pt to send in BP log through my chart for provider to review.

## 2023-10-14 ENCOUNTER — Ambulatory Visit (HOSPITAL_COMMUNITY)
Admission: RE | Admit: 2023-10-14 | Discharge: 2023-10-14 | Disposition: A | Discharge status: Home / Self Care | Payer: Medicare Other | Source: Ambulatory Visit | Attending: Gastroenterology | Admitting: Gastroenterology

## 2023-10-14 DIAGNOSIS — K7689 Other specified diseases of liver: Secondary | ICD-10-CM | POA: Diagnosis not present

## 2023-10-14 DIAGNOSIS — R1011 Right upper quadrant pain: Secondary | ICD-10-CM | POA: Diagnosis not present

## 2023-10-14 MED ORDER — TECHNETIUM TC 99M MEBROFENIN IV KIT
5.0000 | PACK | Freq: Once | INTRAVENOUS | Status: AC | PRN
  Administered 2023-10-14: 5 via INTRAVENOUS

## 2023-10-28 DIAGNOSIS — R1013 Epigastric pain: Secondary | ICD-10-CM | POA: Diagnosis not present

## 2023-10-29 ENCOUNTER — Other Ambulatory Visit: Payer: Self-pay | Admitting: General Surgery

## 2023-10-29 ENCOUNTER — Encounter: Payer: Self-pay | Admitting: General Surgery

## 2023-10-29 DIAGNOSIS — R1013 Epigastric pain: Secondary | ICD-10-CM

## 2023-11-10 ENCOUNTER — Other Ambulatory Visit: Payer: Medicare Other

## 2023-11-26 DIAGNOSIS — M79641 Pain in right hand: Secondary | ICD-10-CM | POA: Diagnosis not present

## 2023-12-10 DIAGNOSIS — M79641 Pain in right hand: Secondary | ICD-10-CM | POA: Diagnosis not present

## 2024-01-07 DIAGNOSIS — Z4789 Encounter for other orthopedic aftercare: Secondary | ICD-10-CM | POA: Diagnosis not present

## 2024-01-07 DIAGNOSIS — M79644 Pain in right finger(s): Secondary | ICD-10-CM | POA: Diagnosis not present

## 2024-02-02 ENCOUNTER — Other Ambulatory Visit: Payer: Self-pay | Admitting: Family Medicine

## 2024-02-02 DIAGNOSIS — N6323 Unspecified lump in the left breast, lower outer quadrant: Secondary | ICD-10-CM

## 2024-02-02 DIAGNOSIS — N6311 Unspecified lump in the right breast, upper outer quadrant: Secondary | ICD-10-CM | POA: Diagnosis not present

## 2024-02-02 DIAGNOSIS — N6310 Unspecified lump in the right breast, unspecified quadrant: Secondary | ICD-10-CM

## 2024-03-08 ENCOUNTER — Ambulatory Visit
Admission: RE | Admit: 2024-03-08 | Discharge: 2024-03-08 | Disposition: A | Discharge status: Home / Self Care | Payer: Medicare Other | Source: Ambulatory Visit | Attending: Family Medicine | Admitting: Family Medicine

## 2024-03-08 DIAGNOSIS — N6331 Unspecified lump in axillary tail of the right breast: Secondary | ICD-10-CM | POA: Diagnosis not present

## 2024-03-08 DIAGNOSIS — M797 Fibromyalgia: Secondary | ICD-10-CM | POA: Diagnosis not present

## 2024-03-08 DIAGNOSIS — N6323 Unspecified lump in the left breast, lower outer quadrant: Secondary | ICD-10-CM | POA: Diagnosis not present

## 2024-03-08 DIAGNOSIS — G894 Chronic pain syndrome: Secondary | ICD-10-CM | POA: Diagnosis not present

## 2024-03-08 DIAGNOSIS — J45909 Unspecified asthma, uncomplicated: Secondary | ICD-10-CM | POA: Diagnosis not present

## 2024-03-08 DIAGNOSIS — K219 Gastro-esophageal reflux disease without esophagitis: Secondary | ICD-10-CM | POA: Diagnosis not present

## 2024-03-08 DIAGNOSIS — I10 Essential (primary) hypertension: Secondary | ICD-10-CM | POA: Diagnosis not present

## 2024-03-08 DIAGNOSIS — N6310 Unspecified lump in the right breast, unspecified quadrant: Secondary | ICD-10-CM

## 2024-03-08 DIAGNOSIS — Z23 Encounter for immunization: Secondary | ICD-10-CM | POA: Diagnosis not present

## 2024-03-08 DIAGNOSIS — E559 Vitamin D deficiency, unspecified: Secondary | ICD-10-CM | POA: Diagnosis not present

## 2024-03-08 DIAGNOSIS — R7303 Prediabetes: Secondary | ICD-10-CM | POA: Diagnosis not present

## 2024-03-08 DIAGNOSIS — Z0000 Encounter for general adult medical examination without abnormal findings: Secondary | ICD-10-CM | POA: Diagnosis not present

## 2024-03-08 DIAGNOSIS — N6002 Solitary cyst of left breast: Secondary | ICD-10-CM | POA: Diagnosis not present

## 2024-03-09 ENCOUNTER — Telehealth: Payer: Self-pay | Admitting: Internal Medicine

## 2024-03-09 NOTE — Telephone Encounter (Signed)
 Pt c/o BP issue: STAT if pt c/o blurred vision, one-sided weakness or slurred speech.  STAT if BP is GREATER than 180/120 TODAY.  STAT if BP is LESS than 90/60 and SYMPTOMATIC TODAY  1. What is your BP concern? Hypotension  2. Have you taken any BP medication today? No  3. What are your last 5 BP readings? Ranging 100/70-60 HR not going above 65   4. Are you having any other symptoms (ex. Dizziness, headache, blurred vision, passed out)?  Dizziness, always very tired, shaking, feels she will faint when standing up.   Thinks medication adjustments need to be made. States BP range has been ongoing for 2 months. Other symptoms began about two weeks ago. Saw PCP and was advised to see cardiology. Prefers to be seen by [facility]. Please advise.

## 2024-03-09 NOTE — Telephone Encounter (Signed)
 Pt saw her PCP today and her BP has been fluctuating between 100/60 and 120/80... HR 60-80 but sh has been very fatigued.... she has been eating and drinking well.. her PCP says she needs " a change in her meds" but wanted her to see Cardiology for the changes.   I made her an appt to see Dan Humphreys NP next week and asked her to keep a log of her BP and HR to bring with her.

## 2024-03-14 NOTE — Progress Notes (Unsigned)
 Cardiology Office Note:  .   Date:  03/15/2024  ID:  Kiara Wilson, DOB 1961-05-29, MRN [id number] PCP: Merri Brunette, MD  [facility] Providers Cardiologist:  Dietrich Pates, MD    History of Present Illness: .   Kiara Wilson is a 63 y.o. female with history of asthma, fibromyalgia, headaches, palpitations, HTN, HLD  Initially seen in 2011 by cardiologist in [place] due to BP being labile with verapamil added and Toprol discontinued.  Holter monitor with frequent PAC, verapamil increased.  Echo 02/05/2023 normal LVEF 60 to 35%, no RWMA, RV normal, mild MR.  ZIO 01/27/2023 predominantly NSR, Average heart rate 94 bpm, 1 run of VT lasting 11 beats and 12 runs of SVT with longest 14 beats.  PAC burden 3.8%, PVC burden less than 1%.  Last seen in clinic 03/03/2023 with persistent palpitations.  Due to fatigue on metoprolol she was transition to atenolol.  Via MyChart message 09/2023 due to hypotension losartan was discontinued.  Scheduled for office visit after contacting the office noting BP fluctuating 100/60 to 120/80 with heart rate 60-80 associated with fatigue. She reports lightheadedness when changing positions suddenly and has noticed increasing fatigue and sleeping. She has not has any syncopal episodes since September ED visit which was precipitated by quick change in position.She does take her BP at home with systolic around 115. She reports drinking around 32 oz of water daily. She is having difficulty eating with decreased appetite She is being followed by GI and is seeing them today to discuss further evaluation. She reports not drinking caffeine and no palpitations. Labs were drawn by PCP on 03/09/24. Unable to view. Reports no shortness of breath nor dyspnea on exertion. Reports no chest pain, pressure, or tightness. No edema, orthopnea, PND. Reports no palpitations.   Prior antihypertensive Losartan Toprol -lethargic on 50 mg dose Verapamil  ROS: Please see the  history of present illness.    All other systems reviewed and are negative.   Studies Reviewed: Marland Kitchen   EKG Interpretation Date/Time:  Tuesday March 15 2024 08:30:56 EDT Ventricular Rate:  56 PR Interval:  124 QRS Duration:  74 QT Interval:  396 QTC Calculation: 382 R Axis:   22  Text Interpretation: Sinus bradycardia Low voltage QRS Confirmed by Gillian Shields ([id number]) on 03/15/2024 8:35:01 AM    Cardiac Studies & Procedures   ______________________________________________________________________________________________     ECHOCARDIOGRAM  ECHOCARDIOGRAM COMPLETE 02/05/2023  Narrative ECHOCARDIOGRAM REPORT    Patient Name:   Kiara Wilson Date of Exam: 02/05/2023 Medical Rec #:  [id number]           Height:       64.0 in Accession #:    [id number]          Weight:       176.0 lb Date of Birth:  [birth year]            BSA:          1.853 m Patient Age:    61 years            BP:           132/85 mmHg Patient Gender: F                   HR:           82 bpm. Exam Location:  [address]  Procedure: 2D Echo, Cardiac Doppler, Color Doppler and Strain Analysis  Indications:    785.1 Palpitations  History:        Patient has no prior history of Echocardiogram examinations. Risk Factors:Hypertension.  Sonographer:    Clearence Ped RCS Referring Phys: [id number] TESSA N CONTE  IMPRESSIONS   1. Left ventricular ejection fraction, by estimation, is 60 to 65%. The left ventricle has normal function. The left ventricle has no regional wall motion abnormalities. Left ventricular diastolic parameters are indeterminate. The average left ventricular global longitudinal strain is -19.6 %. The global longitudinal strain is normal. 2. Right ventricular systolic function is normal. The right ventricular size is normal. There is normal pulmonary artery systolic pressure. 3. The mitral valve is normal in structure. Mild mitral valve regurgitation. No evidence of mitral stenosis. 4. The aortic  valve is normal in structure. Aortic valve regurgitation is not visualized. No aortic stenosis is present. 5. The inferior vena cava is normal in size with greater than 50% respiratory variability, suggesting right atrial pressure of 3 mmHg.  FINDINGS Left Ventricle: Left ventricular ejection fraction, by estimation, is 60 to 65%. The left ventricle has normal function. The left ventricle has no regional wall motion abnormalities. The average left ventricular global longitudinal strain is -19.6 %. The global longitudinal strain is normal. The left ventricular internal cavity size was normal in size. There is no left ventricular hypertrophy. Left ventricular diastolic parameters are indeterminate.  Right Ventricle: The right ventricular size is normal. No increase in right ventricular wall thickness. Right ventricular systolic function is normal. There is normal pulmonary artery systolic pressure. The tricuspid regurgitant velocity is 2.29 m/s, and with an assumed right atrial pressure of 3 mmHg, the estimated right ventricular systolic pressure is 24.0 mmHg.  Left Atrium: Left atrial size was normal in size.  Right Atrium: Right atrial size was normal in size.  Pericardium: There is no evidence of pericardial effusion.  Mitral Valve: The mitral valve is normal in structure. Mild mitral valve regurgitation. No evidence of mitral valve stenosis.  Tricuspid Valve: The tricuspid valve is normal in structure. Tricuspid valve regurgitation is mild . No evidence of tricuspid stenosis.  Aortic Valve: The aortic valve is normal in structure. Aortic valve regurgitation is not visualized. No aortic stenosis is present.  Pulmonic Valve: The pulmonic valve was normal in structure. Pulmonic valve regurgitation is not visualized. No evidence of pulmonic stenosis.  Aorta: The aortic root is normal in size and structure.  Venous: The inferior vena cava is normal in size with greater than 50% respiratory  variability, suggesting right atrial pressure of 3 mmHg.  IAS/Shunts: No atrial level shunt detected by color flow Doppler.   LEFT VENTRICLE PLAX 2D LVIDd:         4.50 cm   Diastology LVIDs:         2.90 cm   LV e' medial:    10.10 cm/s LV PW:         1.00 cm   LV E/e' medial:  7.3 LV IVS:        0.80 cm   LV e' lateral:   14.90 cm/s LVOT diam:     2.00 cm   LV E/e' lateral: 5.0 LV SV:         71 LV SV Index:   38        2D Longitudinal Strain LVOT Area:     3.14 cm  2D Strain GLS (A2C):   -20.6 % 2D Strain GLS (A3C):   -18.2 % 2D Strain GLS (A4C):   -20.1 % 2D Strain GLS  Avg:     -19.6 %  RIGHT VENTRICLE RV Basal diam:  3.10 cm RV S prime:     13.80 cm/s TAPSE (M-mode): 1.8 cm RVSP:           24.0 mmHg  LEFT ATRIUM             Index        RIGHT ATRIUM           Index LA diam:        3.10 cm 1.67 cm/m   RA Pressure: 3.00 mmHg LA Vol (A2C):   50.5 ml 27.25 ml/m  RA Area:     11.60 cm LA Vol (A4C):   33.7 ml 18.19 ml/m  RA Volume:   25.10 ml  13.55 ml/m LA Biplane Vol: 41.7 ml 22.51 ml/m AORTIC VALVE LVOT Vmax:   119.00 cm/s LVOT Vmean:  83.800 cm/s LVOT VTI:    0.226 m  AORTA Ao Root diam: 2.70 cm Ao Asc diam:  3.20 cm  MITRAL VALVE               TRICUSPID VALVE MV Area (PHT):             TR Peak grad:   21.0 mmHg MV Decel Time:             TR Vmax:        229.00 cm/s MV E velocity: 73.90 cm/s  Estimated RAP:  3.00 mmHg MV A velocity: 84.90 cm/s  RVSP:           24.0 mmHg MV E/A ratio:  0.87 SHUNTS Systemic VTI:  0.23 m Systemic Diam: 2.00 cm  Kardie Tobb DO Electronically signed by Thomasene Ripple DO Signature Date/Time: 02/05/2023/2:01:24 PM    Final    MONITORS  LONG TERM MONITOR (3-14 DAYS) 02/12/2023  Narrative Patch Wear Time:  7 days and 9 hours (2024-02-06T15:26:27-0500 to 2024-02-14T00:41:19-0500)  Impression:  Sinus rhythm:   Rates 60 to 144 bpm  Average HR 94 bpm    Occasional PAC, rare PVC   12 short bursts of SVT, longest 14 beats at 132  bpm   Fastest SVT for 7 beats at 197 bpm.    1 episode VT for 11 beats at 200 bpm None of SVT or VT sensed Triggered events corresponded to SR with PAC, PVC  ______________________________________________________________________________________________________________  Patient had a min HR of 60 bpm, max HR of 200 bpm, and avg HR of 94 bpm. Predominant underlying rhythm was Sinus Rhythm. 1 run of Ventricular Tachycardia occurred lasting 11 beats with a max rate of 200 bpm (avg 186 bpm). 12 Supraventricular Tachycardia runs occurred, the run with the fastest interval lasting 7 beats with a max rate of 197 bpm, the longest lasting 14 beats with an avg rate of 132 bpm. Isolated SVEs were occasional (3.8%, 29347), SVE Couplets were rare (<1.0%, 114), and SVE Triplets were rare (<1.0%, 11). Isolated VEs were rare (<1.0%), VE Couplets were rare (<1.0%), and no VE Triplets were present.       ______________________________________________________________________________________________      Risk Assessment/Calculations:             Physical Exam:   VS:  BP 105/74 (BP Location: Left Arm, Cuff Size: Large)   Pulse 66   Ht 5\' 4"  (1.626 m)   Wt 186 lb 9.6 oz (84.6 kg)   LMP 08/08/2012   SpO2 95%   BMI 32.03 kg/m    Wt Readings from Last 3  Encounters:  03/15/24 186 lb 9.6 oz (84.6 kg)  03/03/23 184 lb 12.8 oz (83.8 kg)  01/27/23 176 lb (79.8 kg)    GEN: Well nourished, well developed in no acute distress NECK: No JVD; No carotid bruits CARDIAC: RRR, no murmurs, rubs, gallops RESPIRATORY:  Clear to auscultation without rales, wheezing or rhonchi  ABDOMEN: Soft, non-tender, non-distended EXTREMITIES:  No edema; No deformity   ASSESSMENT AND PLAN: .    HTN / Lightheadedness-now with relative hypotension symptomatic with lightheadedness.  Losartan previously discontinued.  Reduce atenolol from 25 twice daily to daily.  Discussed to monitor BP at home at least 2 hours after medications and  sitting for 5-10 minutes.Encouraged drinking at least 40 oz of water daily and trying to consume more food. Discussed wearing compression stockings and changing positions slowly. PCP did CBC 03/09/24 and unable to view results, will request.  Palpitations- Denies any recent palpitations. Decrease Atenolol dose to 25 mg once daily due to hypotension. EKG today unremarkable.   HLD- 08/2023 total cholesterol 179, LDL 99, HDL 49, triglycerides 178. Managed with diet/exercise. Does not meet ASCVD risk criteria for lipid lowering agent. Recommend aiming for 150 minutes of moderate intensity activity per week and following a heart healthy diet. Will continue to monitor.         Dispo: Follow up in 2-3 months.   Signed, Alver Sorrow, NP

## 2024-03-15 ENCOUNTER — Ambulatory Visit (HOSPITAL_BASED_OUTPATIENT_CLINIC_OR_DEPARTMENT_OTHER): Admitting: Family

## 2024-03-15 ENCOUNTER — Encounter (HOSPITAL_BASED_OUTPATIENT_CLINIC_OR_DEPARTMENT_OTHER): Payer: Self-pay | Admitting: Family

## 2024-03-15 VITALS — BP 105/74 | HR 66 | Ht 64.0 in | Wt 186.6 lb

## 2024-03-15 DIAGNOSIS — M79644 Pain in right finger(s): Secondary | ICD-10-CM | POA: Diagnosis not present

## 2024-03-15 DIAGNOSIS — E782 Mixed hyperlipidemia: Secondary | ICD-10-CM

## 2024-03-15 DIAGNOSIS — R42 Dizziness and giddiness: Secondary | ICD-10-CM

## 2024-03-15 DIAGNOSIS — K219 Gastro-esophageal reflux disease without esophagitis: Secondary | ICD-10-CM | POA: Diagnosis not present

## 2024-03-15 DIAGNOSIS — K5730 Diverticulosis of large intestine without perforation or abscess without bleeding: Secondary | ICD-10-CM | POA: Diagnosis not present

## 2024-03-15 DIAGNOSIS — R1013 Epigastric pain: Secondary | ICD-10-CM | POA: Diagnosis not present

## 2024-03-15 DIAGNOSIS — I10 Essential (primary) hypertension: Secondary | ICD-10-CM | POA: Diagnosis not present

## 2024-03-15 DIAGNOSIS — K449 Diaphragmatic hernia without obstruction or gangrene: Secondary | ICD-10-CM | POA: Diagnosis not present

## 2024-03-15 DIAGNOSIS — R002 Palpitations: Secondary | ICD-10-CM

## 2024-03-15 MED ORDER — ATENOLOL 25 MG PO TABS: 25.0000 mg | ORAL_TABLET | Freq: Every day | ORAL | 3 refills | Status: AC

## 2024-03-15 NOTE — Patient Instructions (Addendum)
 Medication Instructions:  Your physician has recommended you make the following change in your medication:   Reduce Atenolol 25mg  daily   Follow-Up: At [facility], you and your health needs are our priority.  As part of our continuing mission to provide you with exceptional heart care, we have created designated Provider Care Teams.  These Care Teams include your primary Cardiologist (physician) and Advanced Practice Providers (APPs -  Physician Assistants and Nurse Practitioners) who all work together to provide you with the care you need, when you need it.  We recommend signing up for the patient portal called "MyChart".  Sign up information is provided on this After Visit Summary.  MyChart is used to connect with patients for Virtual Visits (Telemedicine).  Patients are able to view lab/test results, encounter notes, upcoming appointments, etc.  Non-urgent messages can be sent to your provider as well.   To learn more about what you can do with MyChart, go to [email or web address].    Your next appointment:   2-3 months with Dr. Tenny Craw or Jari Favre, NP   Other Instructions To prevent palpitations: Make sure you are adequately hydrated.  Avoid and/or limit caffeine containing beverages like soda or tea. Exercise regularly.  Manage stress well. Some over the counter medications can cause palpitations such as Benadryl, AdvilPM, TylenolPM. Regular Advil or Tylenol do not cause palpitations.

## 2024-03-21 DIAGNOSIS — R1310 Dysphagia, unspecified: Secondary | ICD-10-CM | POA: Diagnosis not present

## 2024-03-21 DIAGNOSIS — R1013 Epigastric pain: Secondary | ICD-10-CM | POA: Diagnosis not present

## 2024-03-21 DIAGNOSIS — K219 Gastro-esophageal reflux disease without esophagitis: Secondary | ICD-10-CM | POA: Diagnosis not present

## 2024-03-21 DIAGNOSIS — K3189 Other diseases of stomach and duodenum: Secondary | ICD-10-CM | POA: Diagnosis not present

## 2024-05-11 DIAGNOSIS — H43812 Vitreous degeneration, left eye: Secondary | ICD-10-CM | POA: Diagnosis not present

## 2024-05-11 DIAGNOSIS — H2513 Age-related nuclear cataract, bilateral: Secondary | ICD-10-CM | POA: Diagnosis not present

## 2024-06-13 DIAGNOSIS — L820 Inflamed seborrheic keratosis: Secondary | ICD-10-CM | POA: Diagnosis not present

## 2024-06-13 DIAGNOSIS — L738 Other specified follicular disorders: Secondary | ICD-10-CM | POA: Diagnosis not present

## 2024-06-13 DIAGNOSIS — L2089 Other atopic dermatitis: Secondary | ICD-10-CM | POA: Diagnosis not present

## 2024-06-29 NOTE — Progress Notes (Unsigned)
 Cardiology Office Note   Date:  06/29/2024   ID:  Kiara Wilson, Kiara Wilson 23-Apr-1961, MRN [id number]  PCP:  Claudene Pellet, MD  Cardiologist:   Vina Gull, MD   Pt presents for evaluation of heart racing      History of Present Illness: Kiara Wilson is a 63 y.o. female with a history of asthma, fibromyalgia, headaches, palpitations and tachycardia   She originally was seen in 2011 by a cardiologist in [place]  BP noted to be labile  Verapamill added and ? toprol  d/c'd.   Holter reported to show frequent PACs.  Verapmil increased to 180 mg per day   Pt also felt to have panic spells which were exacerbating spells as well  Klonopin added.  Cymbalta added for fibromyalgia.    She is follower in [place] by [employer])  She was seen in Feb    Now on losartan .  The dose ws increased to improve BP control  The pt says over past 10 years periods of skips come and go.   HR in 90s to 100s when she takes it   This bothers her  Worries about heart   She also says she feels tired all the time Denies CP   Notes some dizziness but no syncope   She does not drink caffeine drinks    I saw the pt in 2021   Since then she has been seen by APPs, last time in March 2024   At that time BP wa 100 to 120   SHe reported fatigue, dizziness  Reported drinking only 32 oz per day, eating down.  At that time atenolol  was decreased to 25 mg bid     No outpatient medications have been marked as taking for the 06/30/24 encounter (Appointment) with Gull Vina GAILS, MD.     Allergies:   Amoxicillin, Bupropion, Duloxetine hcl, Fexofenadine, Loratadine, Naproxen , Pregabalin, Trazodone hcl, Verapamil, and Adhesive [tape]   Past Medical History:  Diagnosis Date   Allergy    Anxiety    Arthritis    Breast cancer (HCC) 09/2007   Depression    Depression with anxiety 01/08/2020   Essential hypertension 01/08/2020   Fibromyalgia    GERD (gastroesophageal reflux disease)    Hypertension     Migraine 01/08/2020   Migraines    Overactive bladder    Personal history of radiation therapy     Past Surgical History:  Procedure Laterality Date   BREAST BIOPSY Left    benign   BREAST LUMPECTOMY Right 2008   EYE SURGERY     PELVIC LAPAROSCOPY  1992     Social History:  The patient  reports that she has never smoked. She has never used smokeless tobacco. She reports that she does not drink alcohol and does not use drugs.   Family History:  The patient's family history includes Depression in her mother; Diabetes in her maternal grandmother and paternal grandmother; Fibromyalgia in her mother; High blood pressure in her father; Migraines in her mother.    ROS:  Please see the history of present illness. All other systems are reviewed and  Negative to the above problem except as noted.    PHYSICAL EXAM: VS:  LMP 08/08/2012    Orthostatics:  Laying:   119/86  P 93   Sitting:   128/91  P100  Standing 2 minutes:   119/85  P 107   4 min   125/90  P 107  GEN: Well nourished, well developed, in no acute distress  HEENT: normal  Neck: no JVD, carotid bruits, or masses Cardiac: RRR; no murmurs, rubs, or gallops,no edema  Respiratory:  clear to auscultation bilaterally,  GI: soft, nontender, nondistended, + BS  No hepatomegaly  MS: no deformity Moving all extremities   Skin: warm and dry, no rash Neuro:  Strength and sensation are intact Psych: euthymic mood, full affect   EKG:  EKG is ordered today.  Sinus tachycardai 107 bpm    Event monitor   Feb 2024   Impression:  Sinus rhythm:   Rates 60 to 144 bpm  Average HR 94 bpm    Occasional PAC, rare PVC   12 short bursts of SVT, longest 14 beats at 132 bpm   Fastest SVT for 7 beats at 197 bpm.    1 episode VT for 11 beats at 200 bpm None of SVT or VT sensed Triggered events corresponded to SR with PAC, PVC  Echo   OCt 2024   1. Left ventricular ejection fraction, by estimation, is 60 to 65%. The  left ventricle has normal  function. The left ventricle has no regional  wall motion abnormalities. Left ventricular diastolic parameters are  indeterminate. The average left  ventricular global longitudinal strain is -19.6 %. The global longitudinal  strain is normal.   2. Right ventricular systolic function is normal. The right ventricular  size is normal. There is normal pulmonary artery systolic pressure.   3. The mitral valve is normal in structure. Mild mitral valve  regurgitation. No evidence of mitral stenosis.   4. The aortic valve is normal in structure. Aortic valve regurgitation is  not visualized. No aortic stenosis is present.   5. The inferior vena cava is normal in size with greater than 50%  respiratory variability, suggesting right atrial pressure of 3 mmHg.     Lipid Panel No results found for: CHOL, TRIG, HDL, CHOLHDL, VLDL, LDLCALC, LDLDIRECT    Wt Readings from Last 3 Encounters:  03/15/24 186 lb 9.6 oz (84.6 kg)  03/03/23 184 lb 12.8 oz (83.8 kg)  01/27/23 176 lb (79.8 kg)      ASSESSMENT AND PLAN:  1  Palpitations   Spells are bothersome but I am not convinced they are hemodynamically destabiling and I do not think they represent signif/severe arrhythmia    On exam today her heart rates with standing are fast but she does not meet criteria for orthostatic hypotension/POS I would recomm Zio monitor to look at ectopy burden and overall heart rate control Would also check UA today to see SG, assess hydratoin   Stay hydrated   2  HTN  Diastolic is a little high on current regimen   She may actually from low dose b blocker  Follow for now She does complain of an intermitt cough    I am not convinced it is related to losartan     Would follow for now    3  HCM  Lpiids in Feb :   LDL 80  HDL 40  Good controll    Current medicines are reviewed at length with the patient today.  The patient does not have concerns regarding medicines.  Signed, Vina Gull, MD  06/29/2024  10:07 AM    [facility] [address] [place], [place], [place]  [address] Phone: [phone]; Fax: [phone]

## 2024-06-30 ENCOUNTER — Encounter: Payer: Self-pay | Admitting: Internal Medicine

## 2024-06-30 ENCOUNTER — Ambulatory Visit: Attending: Internal Medicine | Admitting: Internal Medicine

## 2024-06-30 VITALS — BP 114/74 | HR 68 | Ht 64.0 in | Wt 187.0 lb

## 2024-06-30 DIAGNOSIS — R0602 Shortness of breath: Secondary | ICD-10-CM | POA: Diagnosis not present

## 2024-06-30 NOTE — Patient Instructions (Signed)
 Medication Instructions:  The current medical regimen is effective;  continue present plan and medications.  *If you need a refill on your cardiac medications before your next appointment, please call your pharmacy*  Testing/Procedures: Your physician has recommended that you have a pulmonary function test. Pulmonary Function Tests are a group of tests that measure how well air moves in and out of your lungs. You will be contacted to be scheduled.  Follow-Up: At [facility], you and your health needs are our priority.  As part of our continuing mission to provide you with exceptional heart care, our providers are all part of one team.  This team includes your primary Cardiologist (physician) and Advanced Practice Providers or APPs (Physician Assistants and Nurse Practitioners) who all work together to provide you with the care you need, when you need it.  Your next appointment:   8 month(s)  Provider:   Vina Gull, MD    We recommend signing up for the patient portal called MyChart.  Sign up information is provided on this After Visit Summary.  MyChart is used to connect with patients for Virtual Visits (Telemedicine).  Patients are able to view lab/test results, encounter notes, upcoming appointments, etc.  Non-urgent messages can be sent to your provider as well.   To learn more about what you can do with MyChart, go to [email or web address].

## 2024-07-28 ENCOUNTER — Ambulatory Visit (HOSPITAL_COMMUNITY)
Admission: RE | Admit: 2024-07-28 | Discharge: 2024-07-28 | Disposition: A | Discharge status: Home / Self Care | Source: Ambulatory Visit | Attending: Internal Medicine | Admitting: Internal Medicine

## 2024-07-28 DIAGNOSIS — R0602 Shortness of breath: Secondary | ICD-10-CM | POA: Diagnosis not present

## 2024-07-28 LAB — PULMONARY FUNCTION TEST
DL/VA % pred: 114 %
DL/VA: 4.78 ml/min/mmHg/L
DLCO unc % pred: 93 %
DLCO unc: 18.56 ml/min/mmHg
FEF 25-75 Pre: 2.26 L/s
FEF2575-%Pred-Pre: 103 %
FEV1-%Pred-Pre: 83 %
FEV1-Pre: 2.04 L
FEV1FVC-%Pred-Pre: 110 %
FEV6-%Pred-Pre: 77 %
FEV6-Pre: 2.38 L
FEV6FVC-%Pred-Pre: 104 %
FVC-%Pred-Pre: 74 %
FVC-Pre: 2.38 L
Pre FEV1/FVC ratio: 85 %
Pre FEV6/FVC Ratio: 100 %
RV % pred: 102 %
RV: 2.11 L
TLC % pred: 94 %
TLC: 4.79 L

## 2024-07-28 MED ORDER — ALBUTEROL SULFATE (2.5 MG/3ML) 0.083% IN NEBU
2.5000 mg | INHALATION_SOLUTION | Freq: Once | RESPIRATORY_TRACT | Status: AC
  Administered 2024-07-28: 2.5 mg via RESPIRATORY_TRACT

## 2024-08-01 ENCOUNTER — Ambulatory Visit: Payer: Self-pay | Admitting: Internal Medicine

## 2024-08-04 ENCOUNTER — Encounter: Payer: Self-pay | Admitting: Internal Medicine

## 2024-09-09 DIAGNOSIS — J45909 Unspecified asthma, uncomplicated: Secondary | ICD-10-CM | POA: Diagnosis not present

## 2024-09-09 DIAGNOSIS — R0602 Shortness of breath: Secondary | ICD-10-CM | POA: Diagnosis not present

## 2024-09-09 DIAGNOSIS — M797 Fibromyalgia: Secondary | ICD-10-CM | POA: Diagnosis not present

## 2024-09-09 DIAGNOSIS — K219 Gastro-esophageal reflux disease without esophagitis: Secondary | ICD-10-CM | POA: Diagnosis not present

## 2024-09-09 DIAGNOSIS — R7303 Prediabetes: Secondary | ICD-10-CM | POA: Diagnosis not present

## 2024-09-09 DIAGNOSIS — R61 Generalized hyperhidrosis: Secondary | ICD-10-CM | POA: Diagnosis not present

## 2024-09-09 DIAGNOSIS — I10 Essential (primary) hypertension: Secondary | ICD-10-CM | POA: Diagnosis not present

## 2024-09-09 DIAGNOSIS — R002 Palpitations: Secondary | ICD-10-CM | POA: Diagnosis not present

## 2024-09-09 DIAGNOSIS — G4700 Insomnia, unspecified: Secondary | ICD-10-CM | POA: Diagnosis not present

## 2024-09-27 ENCOUNTER — Ambulatory Visit

## 2024-09-27 VITALS — BP 110/68 | HR 72 | Temp 98.8°F | Ht 64.0 in | Wt 190.2 lb

## 2024-09-27 DIAGNOSIS — Z836 Family history of other diseases of the respiratory system: Secondary | ICD-10-CM

## 2024-09-27 DIAGNOSIS — J309 Allergic rhinitis, unspecified: Secondary | ICD-10-CM | POA: Diagnosis not present

## 2024-09-27 DIAGNOSIS — M1990 Unspecified osteoarthritis, unspecified site: Secondary | ICD-10-CM

## 2024-09-27 DIAGNOSIS — M353 Polymyalgia rheumatica: Secondary | ICD-10-CM

## 2024-09-27 DIAGNOSIS — R0602 Shortness of breath: Secondary | ICD-10-CM

## 2024-09-27 DIAGNOSIS — Z23 Encounter for immunization: Secondary | ICD-10-CM | POA: Diagnosis not present

## 2024-09-27 DIAGNOSIS — R002 Palpitations: Secondary | ICD-10-CM | POA: Diagnosis not present

## 2024-09-27 DIAGNOSIS — M797 Fibromyalgia: Secondary | ICD-10-CM | POA: Diagnosis not present

## 2024-09-27 NOTE — Patient Instructions (Signed)
 It was a pleasure to see you today. Please have your labs drawn in our clinic today. Your pulmonary function test will be scheduled closer to your next follow up visit. Your will receive a call for your CT chest scheduling.

## 2024-09-27 NOTE — Assessment & Plan Note (Signed)
  Orders:   Pulmonary function test; Future   Antinuclear Antib (ANA); Future   Rheumatoid Factor; Future   CT CHEST HIGH RESOLUTION; Future

## 2024-09-27 NOTE — Progress Notes (Signed)
 New Patient Pulmonology Office Visit   Subjective:  Patient ID: Kiara Wilson, female    DOB: 03-Jul-1961  MRN: [id number]  Referred by: Alben Therisa MATSU, PA  CC:  Chief Complaint  Patient presents with   Consult    SOB occurs when pt is doing any activities  Dry cough for about 6 months  Pt will get flu shot today.     HPI Kiara Wilson is a 63 y.o. female with history of palpitations, tachycardia, HTN, asthma, fibromyalgia, headaches who is referred to this clinic for shortness of breath  Follows closely with cardiology for palpitations and non sustained PVC/PAC PFT 07/2024 within normal limits   She reports shortness of breath with any activity including simple walk.  She reports her shortness of breath really worsened since she was diagnosed with flu last year. She reports fatigue She used to follow with rheumatology before and was diagnosed with polymyalgia rheumatica and was on steroid at some point.  This caused weight gain and she stopped taking it.  Patient reports history of Zahler asthma. 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  Reports suboptimal effect with albuterol  lately Reports cough.  Denies significant wheezing Reports history of chronic sinusitis and headaches  She has history of breast cancer and is status post left breast lumpectomy and radiation  She is a lifelong non-smoker.  Used to work as a Engineer, site     ROS Review of symptoms negative except mentioned above   Allergies: Amoxicillin, Bupropion, Duloxetine hcl, Fexofenadine, Loratadine, Naproxen , Pregabalin, Trazodone hcl, Verapamil, and Adhesive [tape]  Current Outpatient Medications:    acetaminophen  (TYLENOL ) 500 MG tablet, Take 500 mg by mouth 2 (two) times daily., Disp: , Rfl:    albuterol  (VENTOLIN  HFA) 108 (90 Base) MCG/ACT inhaler, Inhale 2 puffs into the lungs every 4 (four) hours as needed., Disp: , Rfl:    APPLE  CIDER VINEGAR PO, Take 2 each by mouth daily., Disp: , Rfl:    atenolol  (TENORMIN ) 25 MG tablet, Take 1 tablet (25 mg total) by mouth daily., Disp: 90 tablet, Rfl: 3   cetirizine (ZYRTEC) 10 MG tablet, Take 10 mg by mouth daily., Disp: , Rfl:    Cholecalciferol (VITAMIN D3 PO), Take 5,000 Int'l Units by mouth daily., Disp: , Rfl:    clonazePAM (KLONOPIN) 1 MG tablet, 1/2-1 TABLET AS NEEDED ORALLY TWICE A DAY 30 DAYS, Disp: , Rfl:    fluticasone (FLONASE) 50 MCG/ACT nasal spray, Place into both nostrils., Disp: , Rfl:    traMADol (ULTRAM) 50 MG tablet, Take 50 mg by mouth as needed., Disp: , Rfl:    venlafaxine XR (EFFEXOR-XR) 75 MG 24 hr capsule, Take 75 mg by mouth daily., Disp: , Rfl:    zinc sulfate, 50mg  elemental zinc, 220 (50 Zn) MG capsule, Take 220 mg by mouth daily., Disp: , Rfl:    cyclobenzaprine  (FLEXERIL ) 10 MG tablet, Take 1 tablet (10 mg total) by mouth at bedtime., Disp: 90 tablet, Rfl: 3   diclofenac  Sodium (VOLTAREN ) 1 % GEL, Apply 2 g topically 4 (four) times daily. Apply to thumb and pinkie (Patient not taking: Reported on 09/27/2024), Disp: 100 g, Rfl: 0   ibuprofen  (ADVIL ) 600 MG tablet, Take 1 tablet (600 mg total) by mouth every 6 (six) hours as needed. Take with food, Disp: 30 tablet, Rfl: 0   Multiple Vitamins-Minerals (MULTIVITAMIN WITH MINERALS) tablet, Take 1 tablet by mouth daily. (Patient not taking: Reported  on 09/27/2024), Disp: , Rfl:    rizatriptan  (MAXALT -MLT) 10 MG disintegrating tablet, Take 1 tablet (10 mg total) by mouth as needed for migraine. May repeat in 2 hours if needed (Patient not taking: Reported on 09/27/2024), Disp: 9 tablet, Rfl: 11 Past Medical History:  Diagnosis Date   Allergy    Anxiety    Arthritis    Breast cancer (HCC) 09/2007   Depression    Depression with anxiety 01/08/2020   Essential hypertension 01/08/2020   Fibromyalgia    GERD (gastroesophageal reflux disease)    Hypertension    Migraine 01/08/2020   Migraines    Overactive  bladder    Personal history of radiation therapy    Past Surgical History:  Procedure Laterality Date   BREAST BIOPSY Left    benign   BREAST LUMPECTOMY Right 2008   EYE SURGERY     PELVIC LAPAROSCOPY  1992   Family History  Problem Relation Age of Onset   Migraines Mother    Depression Mother    Fibromyalgia Mother    High blood pressure Father    Diabetes Maternal Grandmother    Diabetes Paternal Grandmother    Social History   Socioeconomic History   Marital status: Legally Separated    Spouse name: Not on file   Number of children: 0   Years of education: 13   Highest education level: Not on file  Occupational History   Not on file  Tobacco Use   Smoking status: Never   Smokeless tobacco: Never  Vaping Use   Vaping status: Never Used  Substance and Sexual Activity   Alcohol use: No    Comment: rare   Drug use: No   Sexual activity: Yes    Birth control/protection: Post-menopausal  Other Topics Concern   Not on file  Social History Narrative   Currently legally separated   Lives alone   Right handed   Caffeine: only drinks decaf d/t palpitations with caffeine   Disabled-fibromyalgia, chronic pain, depression/anxiety   Social Drivers of Corporate investment banker Strain: Not on file  Food Insecurity: Not on file  Transportation Needs: Not on file  Physical Activity: Not on file  Stress: Not on file  Social Connections: Not on file  Intimate Partner Violence: Not on file         Objective:  BP 110/68   Pulse 72   Temp 98.8 F (37.1 C) (Oral)   Ht 5' 4 (1.626 m)   Wt 190 lb 3.2 oz (86.3 kg)   LMP 08/08/2012   SpO2 98% Comment: on RA  BMI 32.65 kg/m    Physical Exam Constitutional:      General: She is not in acute distress.    Appearance: Normal appearance.  HENT:     Mouth/Throat:     Mouth: Mucous membranes are moist.  Cardiovascular:     Rate and Rhythm: Normal rate.  Pulmonary:     Effort: No respiratory distress.      Breath sounds: No wheezing or rales.  Musculoskeletal:        General: Swelling present.     Right lower leg: No edema.     Left lower leg: No edema.  Skin:    General: Skin is warm.  Neurological:     Mental Status: She is alert and oriented to person, place, and time.  Psychiatric:        Mood and Affect: Mood normal.     Diagnostic Review:  Pft    Latest Ref Rng & Units 07/28/2024    1:54 PM  PFT Results  FVC-Pre L 2.38   FVC-Predicted Pre % 74   Pre FEV1/FVC % % 85   FEV1-Pre L 2.04   FEV1-Predicted Pre % 83   DLCO uncorrected ml/min/mmHg 18.56   DLCO UNC% % 93   DLVA Predicted % 114   TLC L 4.79   TLC % Predicted % 94   RV % Predicted % 102        Echo   OCt 2024    1. Left ventricular ejection fraction, by estimation, is 60 to 65%. The  left ventricle has normal function. The left ventricle has no regional  wall motion abnormalities. Left ventricular diastolic parameters are  indeterminate. The average left  ventricular global longitudinal strain is -19.6 %. The global longitudinal  strain is normal.   2. Right ventricular systolic function is normal. The right ventricular  size is normal. There is normal pulmonary artery systolic pressure.   3. The mitral valve is normal in structure. Mild mitral valve  regurgitation. No evidence of mitral stenosis.   4. The aortic valve is normal in structure. Aortic valve regurgitation is  not visualized. No aortic stenosis is present.   5. The inferior vena cava is normal in size with greater than 50%  respiratory variability, suggesting right atrial pressure of 3 mmHg.      Reviewed referral notes, reviewed cardiology notes Personally reviewed PFT and prior chest x-ray from 2024     Assessment & Plan:   Assessment & Plan SOB (shortness of breath) Likely multifactorial Will need to rule out asthma, will obtain PFT with bronchodilator test.  May need to consider methacholine challenge test in the future.  Will  rule out eosinophilia Discussed the symptoms, etiology, pathophysiology, diagnostic test, treatment, flare ups,  prognosis of asthma. Will consider switching to ICS/LABA in next visit.  Will obtain MEP and MIP on PFT.  Does not have known neurological disease but reports fatigue Patient reports her mother had rheumatoid arthritis and pulmonary fibrosis.  Will order HRCT to rule out early interstitial lung disease Orders:   Pulmonary function test; Future   CBC with Differential/Platelet   IgE; Future   Antinuclear Antib (ANA); Future   Rheumatoid Factor; Future   CT CHEST HIGH RESOLUTION; Future  Fibromyalgia Advised patient to reestablish with rheumatology.  I will check ANA and RF today Orders:   CBC with Differential/Platelet   IgE; Future   Antinuclear Antib (ANA); Future   Rheumatoid Factor; Future  Palpitations Follow-up with cardiology closely Pulmonary hypertension not noted on recent echo    Family history of interstitial lung disease  Orders:   Pulmonary function test; Future   Antinuclear Antib (ANA); Future   Rheumatoid Factor; Future   CT CHEST HIGH RESOLUTION; Future  Polymyalgia rheumatica syndrome Previously on steroids Orders:   CT CHEST HIGH RESOLUTION; Future  Arthritis  Orders:   Antinuclear Antib (ANA); Future   Rheumatoid Factor; Future   CT CHEST HIGH RESOLUTION; Future  Allergic sinusitis Continue over-the-counter antihistaminic Orders:   Pulmonary function test; Future   IgE; Future  Need for influenza vaccination  Orders:   Flu vaccine trivalent PF, 6mos and older(Flulaval,Afluria,Fluarix,Fluzone)    Thank you for the opportunity to take part in the care of Kiara Wilson   Return in about 3 weeks (around 10/18/2024).   Negar Sieler Pleas, MD [facility] Office: [phone]   I  personally spent a total of 45 minutes in the care of the patient today including performing a medically appropriate  exam/evaluation, counseling and educating, placing orders, referring and communicating with other health care professionals, documenting clinical information in the EHR, independently interpreting results, and communicating results.

## 2024-09-27 NOTE — Assessment & Plan Note (Addendum)
 Advised patient to reestablish with rheumatology.  I will check ANA and RF today Orders:   CBC with Differential/Platelet   IgE; Future   Antinuclear Antib (ANA); Future   Rheumatoid Factor; Future

## 2024-09-28 LAB — CBC WITH DIFFERENTIAL/PLATELET
Basophils Absolute: 0.1 K/uL (ref 0.0–0.1)
Basophils Relative: 1 % (ref 0.0–3.0)
Eosinophils Absolute: 0.1 K/uL (ref 0.0–0.7)
Eosinophils Relative: 1.7 % (ref 0.0–5.0)
HCT: 42.1 % (ref 36.0–46.0)
Hemoglobin: 13.9 g/dL (ref 12.0–15.0)
Lymphocytes Relative: 23.3 % (ref 12.0–46.0)
Lymphs Abs: 1.3 K/uL (ref 0.7–4.0)
MCHC: 33.1 g/dL (ref 30.0–36.0)
MCV: 88.2 fl (ref 78.0–100.0)
Monocytes Absolute: 0.6 K/uL (ref 0.1–1.0)
Monocytes Relative: 9.9 % (ref 3.0–12.0)
Neutro Abs: 3.6 K/uL (ref 1.4–7.7)
Neutrophils Relative %: 64.1 % (ref 43.0–77.0)
Platelets: 364 K/uL (ref 150.0–400.0)
RBC: 4.77 Mil/uL (ref 3.87–5.11)
RDW: 13.5 % (ref 11.5–15.5)
WBC: 5.6 K/uL (ref 4.0–10.5)

## 2024-09-29 ENCOUNTER — Ambulatory Visit: Payer: Self-pay

## 2024-09-29 ENCOUNTER — Ambulatory Visit
Admission: RE | Admit: 2024-09-29 | Discharge: 2024-09-29 | Disposition: A | Discharge status: Home / Self Care | Source: Ambulatory Visit

## 2024-09-29 DIAGNOSIS — M797 Fibromyalgia: Secondary | ICD-10-CM

## 2024-09-29 DIAGNOSIS — M1990 Unspecified osteoarthritis, unspecified site: Secondary | ICD-10-CM

## 2024-09-29 DIAGNOSIS — R0602 Shortness of breath: Secondary | ICD-10-CM | POA: Diagnosis not present

## 2024-09-29 DIAGNOSIS — Z836 Family history of other diseases of the respiratory system: Secondary | ICD-10-CM

## 2024-09-29 DIAGNOSIS — M353 Polymyalgia rheumatica: Secondary | ICD-10-CM

## 2024-09-29 LAB — ANA: Anti Nuclear Antibody (ANA): POSITIVE — AB

## 2024-09-29 LAB — IGE: IgE (Immunoglobulin E), Serum: 20 kU/L (ref <=114)

## 2024-09-29 LAB — ANTI-NUCLEAR AB-TITER (ANA TITER): ANA Titer 1: 1:40 {titer} — ABNORMAL HIGH

## 2024-09-29 LAB — RHEUMATOID FACTOR: Rheumatoid fact SerPl-aCnc: <10 [IU]/mL (ref <14)

## 2024-09-30 NOTE — Progress Notes (Signed)
 ATC x1. LVMTCB

## 2024-10-05 ENCOUNTER — Telehealth: Payer: Self-pay

## 2024-10-05 NOTE — Telephone Encounter (Signed)
 Copied from CRM [id number]. Topic: General - Other >> Sep 30, 2024  1:42 PM Whitney O wrote: Reason for CRM: patient returning a call she just received concerning some results . Ms megan please give patient a call back . Tried to call the cal but no response  [phone]    VM / LM okay per DPR -  -NFN

## 2024-10-05 NOTE — Telephone Encounter (Signed)
 Copied from CRM #[id number]. Topic: General - Other >> Oct 05, 2024  1:48 PM Ismael A wrote: Reason for CRM: patient called back returning CMA Megan's call - I relayed message:  Please let her know that her autoimmune labs are positive and place a referral to rheumatology if pt is agreeable.   A referral has been placed for you! If you have any questions, please do not hesitate to give our office a call at [phone] - stated she is attempting to schedule with [facility] as she is an established patient there. She will follow up with us  to let us  know if she was able to schedule with them  Has been resolved

## 2024-10-05 NOTE — Progress Notes (Signed)
 ATCx2, LVMTCB. Sent MyChart message and placed referral. NFN

## 2024-10-18 ENCOUNTER — Ambulatory Visit (HOSPITAL_BASED_OUTPATIENT_CLINIC_OR_DEPARTMENT_OTHER)

## 2024-10-18 DIAGNOSIS — Z836 Family history of other diseases of the respiratory system: Secondary | ICD-10-CM

## 2024-10-18 DIAGNOSIS — R0602 Shortness of breath: Secondary | ICD-10-CM

## 2024-10-18 DIAGNOSIS — J309 Allergic rhinitis, unspecified: Secondary | ICD-10-CM

## 2024-10-18 LAB — PULMONARY FUNCTION TEST
FEF 25-75 Post: 2.48 L/s
FEF 25-75 Pre: 2.35 L/s
FEF2575-%Change-Post: 5 %
FEF2575-%Pred-Post: 114 %
FEF2575-%Pred-Pre: 108 %
FEV1-%Change-Post: 2 %
FEV1-%Pred-Post: 86 %
FEV1-%Pred-Pre: 84 %
FEV1-Post: 2.12 L
FEV1-Pre: 2.06 L
FEV1FVC-%Change-Post: 0 %
FEV1FVC-%Pred-Pre: 110 %
FEV6-%Change-Post: 3 %
FEV6-%Pred-Post: 81 %
FEV6-%Pred-Pre: 78 %
FEV6-Post: 2.51 L
FEV6-Pre: 2.42 L
FEV6FVC-%Pred-Post: 104 %
FEV6FVC-%Pred-Pre: 104 %
FVC-%Change-Post: 3 %
FVC-%Pred-Post: 78 %
FVC-%Pred-Pre: 75 %
FVC-Post: 2.51 L
FVC-Pre: 2.42 L
Post FEV1/FVC ratio: 84 %
Post FEV6/FVC ratio: 100 %
Pre FEV1/FVC ratio: 85 %
Pre FEV6/FVC Ratio: 100 %

## 2024-10-18 NOTE — Progress Notes (Signed)
Pre/post spirometry performed today. 

## 2024-10-18 NOTE — Patient Instructions (Signed)
Pre/post spirometry performed today. 

## 2024-10-19 ENCOUNTER — Ambulatory Visit

## 2024-10-19 VITALS — BP 101/65 | HR 91 | Temp 98.9°F | Ht 64.0 in | Wt 193.0 lb

## 2024-10-19 DIAGNOSIS — N63 Unspecified lump in unspecified breast: Secondary | ICD-10-CM | POA: Diagnosis not present

## 2024-10-19 DIAGNOSIS — J4540 Moderate persistent asthma, uncomplicated: Secondary | ICD-10-CM | POA: Diagnosis not present

## 2024-10-19 DIAGNOSIS — R7689 Other specified abnormal immunological findings in serum: Secondary | ICD-10-CM | POA: Diagnosis not present

## 2024-10-19 DIAGNOSIS — Z853 Personal history of malignant neoplasm of breast: Secondary | ICD-10-CM

## 2024-10-19 DIAGNOSIS — R5383 Other fatigue: Secondary | ICD-10-CM

## 2024-10-19 DIAGNOSIS — R0683 Snoring: Secondary | ICD-10-CM

## 2024-10-19 DIAGNOSIS — F419 Anxiety disorder, unspecified: Secondary | ICD-10-CM

## 2024-10-19 DIAGNOSIS — R0602 Shortness of breath: Secondary | ICD-10-CM

## 2024-10-19 DIAGNOSIS — R000 Tachycardia, unspecified: Secondary | ICD-10-CM

## 2024-10-19 MED ORDER — FLUTICASONE PROPIONATE HFA 220 MCG/ACT IN AERO: 1.0000 | INHALATION_SPRAY | Freq: Two times a day (BID) | RESPIRATORY_TRACT | 12 refills | Status: AC

## 2024-10-19 NOTE — Patient Instructions (Signed)
 It was a pleasure to see you today. Try flovent twice a day, Please rinse your mouth after inhaler use.

## 2024-10-19 NOTE — Assessment & Plan Note (Signed)
 As per PCP.

## 2024-10-19 NOTE — Progress Notes (Signed)
 New Patient Pulmonology Office Visit   Initial HPI 09/27/2024  Patient ID: Kiara Wilson, female    DOB: [birth year]  MRN: [id number]  Referred by: Claudene Pellet, MD  CC:  Chief Complaint  Patient presents with   Shortness of Breath    No improvement in breathing. Pft result.    Shortness of Breath   Kiara Wilson is a 63 y.o. female with history of palpitations, tachycardia, HTN, asthma, fibromyalgia, headaches who is referred to this clinic for shortness of breath  Follows closely with cardiology for palpitations and non sustained PVC/PAC PFT 07/2024 within normal limits   She reports shortness of breath with any activity including simple walk.  She reports her shortness of breath really worsened since she was diagnosed with flu last year. She reports fatigue She used to follow with rheumatology before and was diagnosed with polymyalgia rheumatica and was on steroid at some point.  This caused weight gain and she stopped taking it.  Patient reports history of childhood asthma. 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  Reports suboptimal effect with albuterol  lately Reports cough.  Denies significant wheezing Reports history of chronic sinusitis and headaches  She has history of breast cancer and is status post left breast lumpectomy and radiation  She is a lifelong non-smoker.  Used to work as a engineer, site    Subjective:  10/27- labs revealed ANA +ve, referred to rheumatology Completed spirometry with BD and CT chest and labs Continues to have chest tightness, upper back pain Reports tachycardia In doctors office  Pt reports snoring at night and witnessed apneas Reports fatigue during day Goes to bed at 10am, wakes up multiple times at night upto 4-5 times, falls back asleep immediately Wakes up at 6-8 am  Pt on atenolol  for tachycardia Dose was reduced due to presyncopal episodes  Review of  symptoms negative except mentioned above   Allergies: Amoxicillin, Bupropion, Duloxetine hcl, Fexofenadine, Loratadine, Naproxen , Pregabalin, Trazodone hcl, Verapamil, and Adhesive [tape]  Current Outpatient Medications:    acetaminophen  (TYLENOL ) 500 MG tablet, Take 500 mg by mouth 2 (two) times daily., Disp: , Rfl:    albuterol  (VENTOLIN  HFA) 108 (90 Base) MCG/ACT inhaler, Inhale 2 puffs into the lungs every 4 (four) hours as needed., Disp: , Rfl:    APPLE CIDER VINEGAR PO, Take 2 each by mouth daily., Disp: , Rfl:    atenolol  (TENORMIN ) 25 MG tablet, Take 1 tablet (25 mg total) by mouth daily., Disp: 90 tablet, Rfl: 3   cetirizine (ZYRTEC) 10 MG tablet, Take 10 mg by mouth daily., Disp: , Rfl:    Cholecalciferol (VITAMIN D3 PO), Take 5,000 Int'l Units by mouth daily., Disp: , Rfl:    clonazePAM (KLONOPIN) 1 MG tablet, 1/2-1 TABLET AS NEEDED ORALLY TWICE A DAY 30 DAYS, Disp: , Rfl:    fluticasone (FLONASE) 50 MCG/ACT nasal spray, Place into both nostrils., Disp: , Rfl:    fluticasone (FLOVENT HFA) 220 MCG/ACT inhaler, Inhale 1 puff into the lungs in the morning and at bedtime., Disp: 1 each, Rfl: 12   traMADol (ULTRAM) 50 MG tablet, Take 50 mg by mouth as needed., Disp: , Rfl:    zinc sulfate, 50mg  elemental zinc, 220 (50 Zn) MG capsule, Take 220 mg by mouth daily., Disp: , Rfl:    cyclobenzaprine  (FLEXERIL ) 10 MG tablet, Take 1 tablet (10 mg total) by mouth at bedtime., Disp: 90 tablet, Rfl: 3   diclofenac  Sodium (  VOLTAREN ) 1 % GEL, Apply 2 g topically 4 (four) times daily. Apply to thumb and pinkie (Patient not taking: Reported on 10/19/2024), Disp: 100 g, Rfl: 0   ibuprofen  (ADVIL ) 600 MG tablet, Take 1 tablet (600 mg total) by mouth every 6 (six) hours as needed. Take with food, Disp: 30 tablet, Rfl: 0   Multiple Vitamins-Minerals (MULTIVITAMIN WITH MINERALS) tablet, Take 1 tablet by mouth daily. (Patient not taking: Reported on 10/19/2024), Disp: , Rfl:    rizatriptan  (MAXALT -MLT) 10 MG  disintegrating tablet, Take 1 tablet (10 mg total) by mouth as needed for migraine. May repeat in 2 hours if needed (Patient not taking: Reported on 09/27/2024), Disp: 9 tablet, Rfl: 11   venlafaxine XR (EFFEXOR-XR) 75 MG 24 hr capsule, Take 75 mg by mouth daily., Disp: , Rfl:  Past Medical History:  Diagnosis Date   Allergy    Anxiety    Arthritis    Breast cancer (HCC) 09/2007   Depression    Depression with anxiety 01/08/2020   Essential hypertension 01/08/2020   Fibromyalgia    GERD (gastroesophageal reflux disease)    Hypertension    Migraine 01/08/2020   Migraines    Overactive bladder    Personal history of radiation therapy    Past Surgical History:  Procedure Laterality Date   BREAST BIOPSY Left    benign   BREAST LUMPECTOMY Right 2008   EYE SURGERY     PELVIC LAPAROSCOPY  1992   Family History  Problem Relation Age of Onset   Migraines Mother    Depression Mother    Fibromyalgia Mother    High blood pressure Father    Diabetes Maternal Grandmother    Diabetes Paternal Grandmother    Social History   Socioeconomic History   Marital status: Legally Separated    Spouse name: Not on file   Number of children: 0   Years of education: 13   Highest education level: Not on file  Occupational History   Not on file  Tobacco Use   Smoking status: Never   Smokeless tobacco: Never  Vaping Use   Vaping status: Never Used  Substance and Sexual Activity   Alcohol use: No    Comment: rare   Drug use: No   Sexual activity: Yes    Birth control/protection: Post-menopausal  Other Topics Concern   Not on file  Social History Narrative   Currently legally separated   Lives alone   Right handed   Caffeine: only drinks decaf d/t palpitations with caffeine   Disabled-fibromyalgia, chronic pain, depression/anxiety   Social Drivers of Corporate Investment Banker Strain: Not on file  Food Insecurity: Not on file  Transportation Needs: Not on file  Physical Activity:  Not on file  Stress: Not on file  Social Connections: Not on file  Intimate Partner Violence: Not on file         Objective:  BP 101/65   Pulse 91   Temp 98.9 F (37.2 C) (Oral)   Ht 5' 4 (1.626 m)   Wt 193 lb (87.5 kg)   LMP 08/08/2012   SpO2 95%   BMI 33.13 kg/m    Physical Exam Constitutional:      General: She is not in acute distress.    Appearance: Normal appearance.  HENT:     Mouth/Throat:     Mouth: Mucous membranes are moist.  Cardiovascular:     Rate and Rhythm: Normal rate.  Pulmonary:     Effort:  No respiratory distress.     Breath sounds: No wheezing or rales.  Musculoskeletal:        General: Swelling present.     Right lower leg: No edema.     Left lower leg: No edema.  Skin:    General: Skin is warm.  Neurological:     Mental Status: She is alert and oriented to person, place, and time.  Psychiatric:        Mood and Affect: Mood normal.     Diagnostic Review:    Pft    Latest Ref Rng & Units 10/18/2024   12:39 PM 07/28/2024    1:54 PM  PFT Results  FVC-Pre L 2.42  P 2.38   FVC-Predicted Pre % 75  P 74   FVC-Post L 2.51  P   FVC-Predicted Post % 78  P   Pre FEV1/FVC % % 85  P 85   Post FEV1/FCV % % 84  P   FEV1-Pre L 2.06  P 2.04   FEV1-Predicted Pre % 84  P 83   FEV1-Post L 2.12  P   DLCO uncorrected ml/min/mmHg  18.56   DLCO UNC% %  93   DLVA Predicted %  114   TLC L  4.79   TLC % Predicted %  94   RV % Predicted %  102     P Preliminary result   No BD response     HRCT 09/2024 Mild paraspinal scarring. Negative for subpleural reticulation, traction bronchiectasis/bronchiolectasis, ground glass, architectural distortion or honeycombing. Subpleural radiation scarring in the anterior right lung. Minimal lingular scarring. No pleural fluid. Airway is unremarkable. No air trapping. Left breast nodule previously known to pt and worked up  Labs 09/2024 igE 20 Eosinophils 100 ANA 1:40  Echo   OCt 2024    1. Left  ventricular ejection fraction, by estimation, is 60 to 65%. The  left ventricle has normal function. The left ventricle has no regional  wall motion abnormalities. Left ventricular diastolic parameters are  indeterminate. The average left  ventricular global longitudinal strain is -19.6 %. The global longitudinal  strain is normal.   2. Right ventricular systolic function is normal. The right ventricular  size is normal. There is normal pulmonary artery systolic pressure.   3. The mitral valve is normal in structure. Mild mitral valve  regurgitation. No evidence of mitral stenosis.   4. The aortic valve is normal in structure. Aortic valve regurgitation is  not visualized. No aortic stenosis is present.   5. The inferior vena cava is normal in size with greater than 50%  respiratory variability, suggesting right atrial pressure of 3 mmHg.      Reviewed referral notes, reviewed cardiology notes Personally reviewed PFT and prior chest x-ray from 2024     Assessment & Plan:   Assessment & Plan SOB (shortness of breath) No obvious explanation of shortness of breath Prior PFT was within normal limits, repeated a spirometry with BD response testing and it is within normal limits as well CT chest doesn't show ILD or other explanation of dyspnea Will do a trial of ICS inhaler for asthma- avoid beta-agnoist due to tachycardia. Started flovent 220 1 puff BID- I advised pt to rinse mouth after inhaler use  Will consider MCT in next visit - if negative can rule out asthma Consider ischemic evaluation    Positive ANA (antinuclear antibody) Referred to rheumatology    Breast nodule Known hx of right breast can and  radiation Left breast nodule visible on ct chest- worked up by PCP as per pt    Moderate persistent asthma not dependent on systemic steroids Hx of childhood asthma  Didn't perceive benefit on albuterol  and no BD response on PFT Will continue     Tachycardia Pt reports she  is on atenolol  but dose was reduced due to dizziness/passing out Likely anxiety related Follows with cardiology    Anxiety As per PCP    Snoring Based on history and examination, pt is at risk for Obstructive sleep apnea. I have discussed the pathophysiology and prognosis of untreated obstructive sleep apnea and its effects on the risk of motor vehicle accident, cardiovascular disease, risk of stroke and death. I have discussed the pros and cons of treatment with CPAP. Patient is amenable to starting CPAP if required based on sleep apnea test results. I have discussed the importance of compliance with therapy.  Orders:   Home sleep test; Future  Other fatigue Multiple possible causes Osa/ Beta blocker/ depression/anxiety No anemia on labs 09/27/2024      Thank you for the opportunity to take part in the care of Kiara Wilson   Return in about 2 months (around 12/26/2024).   Yazhini Mcaulay Pleas, MD [facility] Office: [phone]   I personally spent a total of 40 minutes in the care of the patient today including performing a medically appropriate exam/evaluation, counseling and educating, placing orders, referring and communicating with other health care professionals, documenting clinical information in the EHR, independently interpreting results, and communicating results.

## 2024-10-20 ENCOUNTER — Telehealth: Payer: Self-pay

## 2024-10-20 NOTE — Telephone Encounter (Signed)
*  Pulm  Pharmacy Patient Advocate Encounter   Received notification from CoverMyMeds that prior authorization for Flovent HFA 220MCG/ACT aerosol   is required/requested.   Insurance verification completed.   The patient is insured through [facility].   Per test claim: PA required; PA submitted to above mentioned insurance via Latent Key/confirmation #/EOC [id number] Status is pending

## 2024-10-21 NOTE — Telephone Encounter (Signed)
 Resubmitting under generic Fluticasone HFA 220mcg.  Pending determination via Latent.  CMM Key: [id number]

## 2024-10-24 NOTE — Telephone Encounter (Signed)
 Dr. Pleas, PA was denied since patient has not tried and failed preferred alternatives:  Arnuity Ellipta, Qvar Redihaler.  Please advise.  Thank you.

## 2024-10-24 NOTE — Telephone Encounter (Signed)
 Pharmacy Patient Advocate Encounter  Received notification from [facility] that Prior Authorization for Fluticasone HFA has been DENIED.  Full denial letter will be uploaded to the media tab. See denial reason below.   Denied due to no information found for reasoning that patient has tried or has a contraindication to preferred alternative:  Arnuity Ellipta Qvar Redihaler

## 2024-10-25 MED ORDER — QVAR REDIHALER 80 MCG/ACT IN AERB: 1.0000 | INHALATION_SPRAY | Freq: Two times a day (BID) | RESPIRATORY_TRACT | 4 refills | Status: AC

## 2024-10-25 MED ORDER — BECLOMETHASONE DIPROP HFA 80 MCG/ACT IN AERB: 1.0000 | INHALATION_SPRAY | Freq: Two times a day (BID) | RESPIRATORY_TRACT | 6 refills | Status: DC

## 2024-10-25 NOTE — Telephone Encounter (Signed)
 Please let the pt know flovent inhaler wasn't covered. I have ordered a different one called Qvar- to be used 1 puff BID. Please advise pt to rinse mouth after inhaler use

## 2024-10-25 NOTE — Addendum Note (Signed)
 Addended by: Kanai Berrios on: 10/25/2024 04:16 PM   Modules accepted: Orders

## 2024-10-26 NOTE — Telephone Encounter (Signed)
 Was there a test claim performed?   Copied from [id number]. Topic: Clinical - Prescription Issue >> Oct 26, 2024 12:01 PM Devaughn RAMAN wrote: Reason for CRM: Pt returning [place] phone call, advised pt of message by [facility] regarding medication. Pt stated the medication is $360 and pt denied getting a different medication, pt stated she will use an albuterol  if needed.

## 2024-10-26 NOTE — Telephone Encounter (Signed)
ATC x 1 

## 2024-10-27 ENCOUNTER — Other Ambulatory Visit (HOSPITAL_COMMUNITY): Payer: Self-pay

## 2024-10-28 NOTE — Telephone Encounter (Signed)
 LMTCB x 1 to discuss per pharmacy claim all inhalers will be costly until deductible has been met.

## 2025-01-19 ENCOUNTER — Ambulatory Visit
